# Patient Record
Sex: Female | Born: 1965 | Smoking: Never smoker
Health system: Southern US, Community
[De-identification: ages and names within clinical notes are randomized; demographics above are authoritative.]

## PROBLEM LIST (undated history)

## (undated) DIAGNOSIS — K859 Acute pancreatitis without necrosis or infection, unspecified: Secondary | ICD-10-CM

## (undated) HISTORY — PX: CHOLECYSTECTOMY: SHX55

---

## 2008-01-18 ENCOUNTER — Other Ambulatory Visit: Payer: Self-pay

## 2008-01-18 ENCOUNTER — Inpatient Hospital Stay: Payer: Self-pay | Admitting: Internal Medicine

## 2008-02-02 ENCOUNTER — Ambulatory Visit: Payer: Self-pay | Admitting: Surgery

## 2013-07-11 ENCOUNTER — Inpatient Hospital Stay: Payer: Self-pay | Admitting: Internal Medicine

## 2013-07-11 LAB — CBC
HCT: 39.6 % (ref 35.0–47.0)
HGB: 12.4 g/dL (ref 12.0–16.0)
MCH: 25.8 pg — ABNORMAL LOW (ref 26.0–34.0)
MCHC: 31.4 g/dL — ABNORMAL LOW (ref 32.0–36.0)
MCV: 82 fL (ref 80–100)
Platelet: 242 10*3/uL (ref 150–440)
RBC: 4.82 10*6/uL (ref 3.80–5.20)
RDW: 13.8 % (ref 11.5–14.5)
WBC: 16.5 10*3/uL — AB (ref 3.6–11.0)

## 2013-07-11 LAB — COMPREHENSIVE METABOLIC PANEL
ALBUMIN: 3.3 g/dL — AB (ref 3.4–5.0)
ALT: 22 U/L (ref 12–78)
Alkaline Phosphatase: 82 U/L
Anion Gap: 8 (ref 7–16)
BUN: 6 mg/dL — AB (ref 7–18)
Bilirubin,Total: 0.2 mg/dL (ref 0.2–1.0)
CHLORIDE: 106 mmol/L (ref 98–107)
Calcium, Total: 8.9 mg/dL (ref 8.5–10.1)
Co2: 23 mmol/L (ref 21–32)
Creatinine: 0.85 mg/dL (ref 0.60–1.30)
EGFR (African American): >60
EGFR (Non-African Amer.): >60
GLUCOSE: 114 mg/dL — AB (ref 65–99)
Osmolality: 272 (ref 275–301)
POTASSIUM: 3.4 mmol/L — AB (ref 3.5–5.1)
SGOT(AST): 24 U/L (ref 15–37)
Sodium: 137 mmol/L (ref 136–145)
Total Protein: 7.5 g/dL (ref 6.4–8.2)

## 2013-07-11 LAB — TROPONIN I
Troponin-I: <0.02 ng/mL
Troponin-I: <0.02 ng/mL
Troponin-I: <0.02 ng/mL

## 2013-07-11 LAB — TSH: THYROID STIMULATING HORM: 1.02 u[IU]/mL

## 2013-07-12 DIAGNOSIS — I059 Rheumatic mitral valve disease, unspecified: Secondary | ICD-10-CM

## 2013-07-12 LAB — BASIC METABOLIC PANEL
Anion Gap: 4 — ABNORMAL LOW (ref 7–16)
BUN: 10 mg/dL (ref 7–18)
CHLORIDE: 108 mmol/L — AB (ref 98–107)
Calcium, Total: 8.5 mg/dL (ref 8.5–10.1)
Co2: 26 mmol/L (ref 21–32)
Creatinine: 0.83 mg/dL (ref 0.60–1.30)
EGFR (African American): >60
Glucose: 106 mg/dL — ABNORMAL HIGH (ref 65–99)
Osmolality: 275 (ref 275–301)
Potassium: 3.6 mmol/L (ref 3.5–5.1)
SODIUM: 138 mmol/L (ref 136–145)

## 2013-07-12 LAB — CBC WITH DIFFERENTIAL/PLATELET
BASOS PCT: 0.2 %
Basophil #: 0 10*3/uL (ref 0.0–0.1)
EOS PCT: 0 %
Eosinophil #: 0 10*3/uL (ref 0.0–0.7)
HCT: 35.1 % (ref 35.0–47.0)
HGB: 11.3 g/dL — AB (ref 12.0–16.0)
Lymphocyte #: 1.8 10*3/uL (ref 1.0–3.6)
Lymphocyte %: 8.9 %
MCH: 26.6 pg (ref 26.0–34.0)
MCHC: 32 g/dL (ref 32.0–36.0)
MCV: 83 fL (ref 80–100)
Monocyte #: 1.4 x10 3/mm — ABNORMAL HIGH (ref 0.2–0.9)
Monocyte %: 6.9 %
NEUTROS PCT: 84 %
Neutrophil #: 16.9 10*3/uL — ABNORMAL HIGH (ref 1.4–6.5)
Platelet: 234 10*3/uL (ref 150–440)
RBC: 4.23 10*6/uL (ref 3.80–5.20)
RDW: 14 % (ref 11.5–14.5)
WBC: 20.2 10*3/uL — ABNORMAL HIGH (ref 3.6–11.0)

## 2013-07-13 LAB — WBC: WBC: 15.7 10*3/uL — ABNORMAL HIGH (ref 3.6–11.0)

## 2013-07-16 LAB — CULTURE, BLOOD (SINGLE)

## 2014-09-11 NOTE — Discharge Summary (Signed)
PATIENT NAME:  Brandi Randolph, Tomara MR#:  [id number] DATE OF BIRTH:  1965-11-08  DATE OF ADMISSION:  07/11/2013 DATE OF DISCHARGE:  07/13/2013  PRESENTING COMPLAINT:  Cough and shortness of breath.   DISCHARGE DIAGNOSES:  1. Right lower lobe pneumonia.  2. Ongoing tobacco abuse.  3. Sinus congestion.  4. Saturation 91% to 95% on room air.   MEDICATIONS: 1. Acetaminophen 325 two tablets every 4 hours as needed.  2. Budesonide/formoterol 160/4.5 inhalation 1 puff b.i.d.  3. Guaifenesin 1200 mg p.o. b.i.d.  4. Tussionex 5 mL b.i.d. p.r.n.  5. Levaquin 750 mg p.o. daily.  6. Combivent Respimat 1 puff 4 times a day as needed.   FOLLOWUP: Establish primary care or go to urgent care as needed. The patient advised to get repeat CT chest in 3 months.   LABORATORY DATA AT DISCHARGE: White count is 15.7. Echo is EF of 60% to 65%. Normal left ventricular systolic function. Mild dilated left atrium. Mild mitral valve regurgitation. Mild tricuspid regurgitation. H and H is 11.3 and 35.1, platelet count is 234. Glucose is 106, BUN is 10, creatinine is 0.83, sodium is 138, potassium is 3.6, chloride is 108. CT chest showed no evidence of PE. Prominent mediastinal hilar lymph nodes of uncertain clinical significance. Right posterior chest wall sebaceous cyst. Followup imaging in 3 months. Blood cultures negative in 48 hours.   BRIEF SUMMARY OF HOSPITAL COURSE: Brandi FlashSusan Hovanec is a 49 year old Caucasian female with a history of ongoing tobacco abuse, who comes into the emergency room with:   1. Systemic inflammatory response syndrome secondary to pneumonia. She was started on Rocephin and Zithromax, which was changed to p.o. Levaquin. Blood cultures remained negative. Mucinex and Tussionex were given with p.r.n. Cepacol lozenges. Her white count came down to 15,000.  2. The patient with ongoing tobacco abuse, likely has underlying chronic obstructive pulmonary disease. She is a long-term smoker. She has no  official diagnosis of chronic obstructive pulmonary disease. She has no wheezing. Steroids were not given. The patient was advised on smoking cessation, and p.r.n. inhalers were prescribed.  3. Obesity.  4. Sinus congestion. The p.r.n. Tylenol, Mucinex and Tussionex were given.  5. Hospital stay otherwise remained stable.   CODE STATUS: The patient remained a full code.   TIME SPENT: 40 minutes.   ____________________________ Wylie HailSona A. Allena KatzPatel, MD sap:lb D: 07/15/2013 06:57:03 ET T: 07/15/2013 08:18:06 ET JOB#: [id number]  cc: Koya Hunger A. Allena KatzPatel, MD, <Dictator> Willow OraSONA A Yoland Scherr MD ELECTRONICALLY SIGNED 07/19/2013 13:40

## 2014-09-11 NOTE — H&P (Signed)
PATIENT NAME:  Brandi Randolph, Brandi Randolph DATE OF BIRTH:  Sep 24, 1965  DATE OF ADMISSION:  07/11/2013  REFERRING PHYSICIAN: Charlestine NightPhillip A. Scotty CourtStafford, MD  FAMILY PHYSICIAN: None.   REASON FOR ADMISSION: Pneumonia with systemic inflammatory response syndrome.   HISTORY OF PRESENT ILLNESS: The patient is a 49 year old female who presents to the Emergency Room with a 4- to 5-day history of congestion, shortness of breath, cough and pleuritic chest pain. Some fever. In the Emergency Room, the patient was noted to be febrile and tachycardic. She was given IV fluids in the Emergency Room, with no improvement of her tachycardia. She remained febrile. Chest x-ray revealed pneumonia. Her white count was also elevated. She is now admitted for further evaluation.   PAST MEDICAL HISTORY:  1. Tobacco abuse.  2. Obesity.  3. Status post tubal ligation.   MEDICATIONS: None.   ALLERGIES: No known drug allergies.   SOCIAL HISTORY: The patient smokes less than 1/2-pack per day. Denies alcohol abuse.   FAMILY HISTORY: Positive for stroke and hypertension, but otherwise unremarkable.   REVIEW OF SYSTEMS:  CONSTITUTIONAL: The patient has had fever, but no change in weight.  EYES: No blurred or double vision. No glaucoma.  ENT: No tinnitus or hearing loss. No nasal discharge or bleeding. She has had some difficulty swallowing.  RESPIRATORY: The patient has had cough and wheezing. Denies hemoptysis.  CARDIOVASCULAR: No orthopnea. Some palpitations. No syncope.  GASTROINTESTINAL: The patient has had a small amount of nausea, vomiting and diarrhea. No abdominal pain.  GENITOURINARY: No dysuria or hematuria. No incontinence.  ENDOCRINE: No polyuria or polydipsia. No heat or cold intolerance.  HEMATOLOGIC: The patient denies anemia, easy bruising or bleeding.  LYMPHATIC: No swollen glands.  MUSCULOSKELETAL: The patient denies pain in her neck, back, shoulders, knees or hips. No gout.  NEUROLOGICAL: No  numbness or migraines. Denies stroke or seizures.  PSYCHOLOGICAL: The patient denies anxiety, insomnia or depression.   PHYSICAL EXAMINATION:  GENERAL: The patient is moderately ill-appearing, in moderate respiratory distress.  VITAL SIGNS: Currently, remarkable for a blood pressure of 147/63 with a heart rate of 120, respiratory rate of 26 and a temperature of 101.6. Saturations are 94% on room air.  HEENT: Normocephalic, atraumatic. Pupils equally round and reactive to light and accommodation. Extraocular movements are intact. Sclerae are nonicteric. Conjunctivae are clear. Oropharynx is erythematous, dry, without exudate.  NECK: Supple without JVD or bruits. No adenopathy or thyromegaly is noted.  LUNGS: Reveal basilar rhonchi, right greater than left. No wheezes or rales. Minimal dullness on the right. Respiratory effort is increased.  CARDIAC: Rapid rate with a regular rhythm. Normal S1 and S2. No significant rubs, murmurs or gallops. PMI is nondisplaced. Chest wall is nontender.  ABDOMEN: Soft, nontender, with normoactive bowel sounds. No organomegaly or masses were appreciated. No hernias or bruits were noted.  EXTREMITIES: Without clubbing, cyanosis or edema. Pulses were 2+ bilaterally.  SKIN: Warm and dry, without rash or lesions.  NEUROLOGIC: Revealed cranial nerves II through XII grossly intact. Deep tendon reflexes were symmetric. Motor and sensory examination is nonfocal.  PSYCHIATRIC: Revealed a patient who is alert and oriented to person, place and time. She was cooperative and used good judgment.   LABORATORY DATA: EKG revealed sinus tachycardia with no acute ischemic changes. Chest x-ray revealed a right lower lobe infiltrate consistent with pneumonia. Her white count was 16.5 with a hemoglobin of 12.4. Glucose 114 with a BUN of 6, creatinine of 0.85 with a sodium of 137, a  potassium of 3.4 and a GFR of greater than 60. ABG on room air revealed a pO2 of 76 with a pH of 7.47.    ASSESSMENT:  1. Pneumonia.  2. Systemic inflammatory response syndrome.  3. Tachycardia.  4. Hypokalemia.  5. Obesity.  6. History of tobacco abuse.  7. Pleuritic chest pain.   PLAN: The patient will be admitted to off-unit telemetry and maintained on IV fluids with potassium supplementation as well as IV antibiotics. Will give a 1-time dose of IV Solu-Medrol. Will begin Symbicort and DuoNeb SVNs. Will supplement oxygen and wean as tolerated. Will follow serial cardiac enzymes because of her chest pain while on telemetry. Will obtain an echocardiogram because of her shortness of breath. Will also obtain a CT of the chest to rule out pulmonary embolism because of her pleuritic-type chest pain. Follow up routine labs in the morning. Regular diet for now. Further treatment and evaluation will depend upon the patient's progress.   TOTAL TIME SPENT ON THIS PATIENT: 50 minutes.   ____________________________ Duane Lope Judithann Sheen, MD jds:lb D: 07/11/2013 10:55:18 ET T: 07/11/2013 11:28:22 ET JOB#: [id number]  cc: Duane Lope. Judithann Sheen, MD, <Dictator> Tajia Szeliga Rodena Medin MD ELECTRONICALLY SIGNED 07/11/2013 14:56

## 2015-10-29 IMAGING — CT CT ANGIO CHEST
2 of 6 series · 18 of 36 positions shown · IV contrast (APPLIED)
Comparison: None.

CLINICAL DATA: Chest congestion and pain.

EXAM:
CT ANGIOGRAPHY CHEST WITH CONTRAST
TECHNIQUE: Multidetector CT imaging of the chest was performed using the
standard protocol during bolus administration of intravenous
contrast. Multiplanar CT image reconstructions and MIPs were
obtained to evaluate the vascular anatomy.
CONTRAST:  100 cc of Isovue 370

[Series 5: pe 1.0 thins · axial · 0.68mm/px · z∈[-646,-379]mm · 17 of 301 slices shown]
[im 17/301  lung]
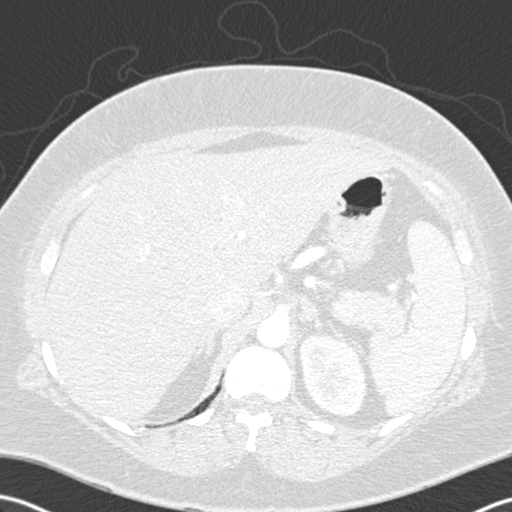
[im 34/301  mediastinal]
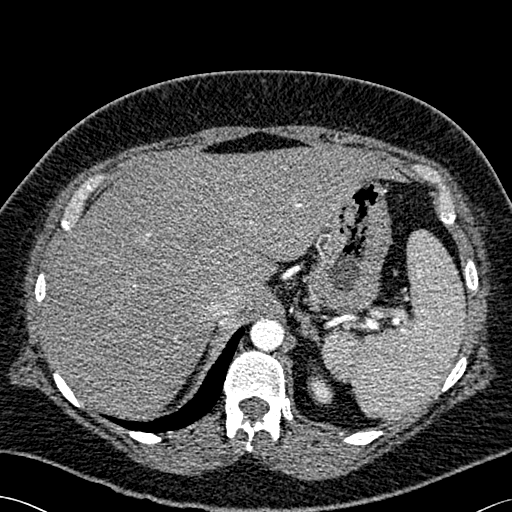
[im 51/301  lung]
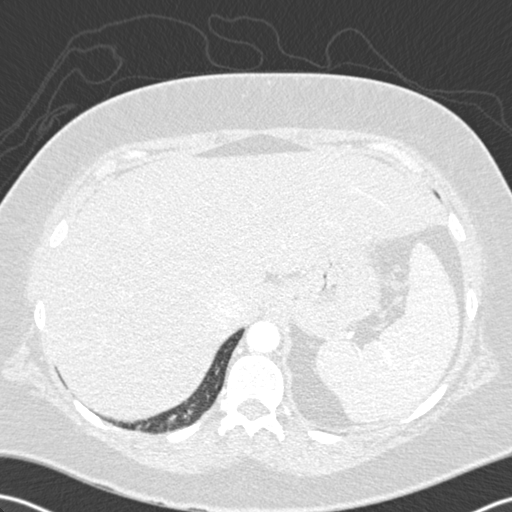
[im 67/301  mediastinal]
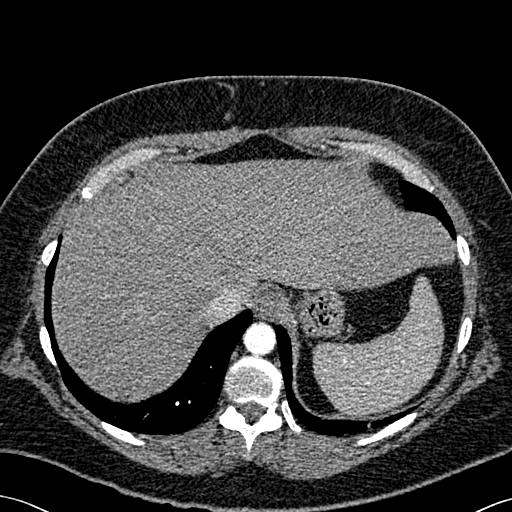
[im 84/301  lung]
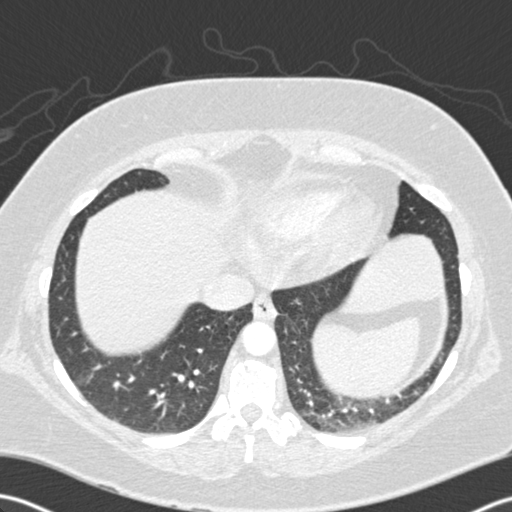
[im 101/301  mediastinal]
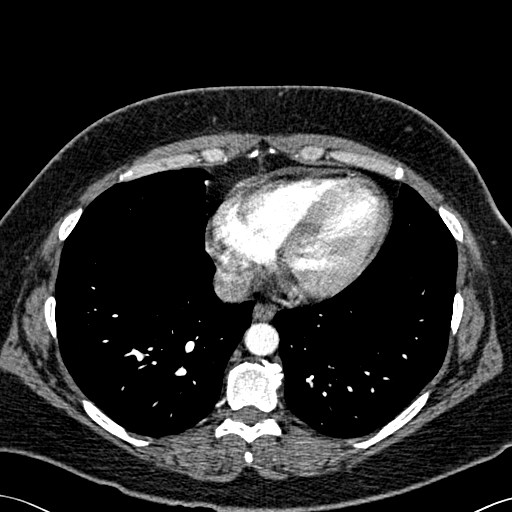
[im 117/301  lung]
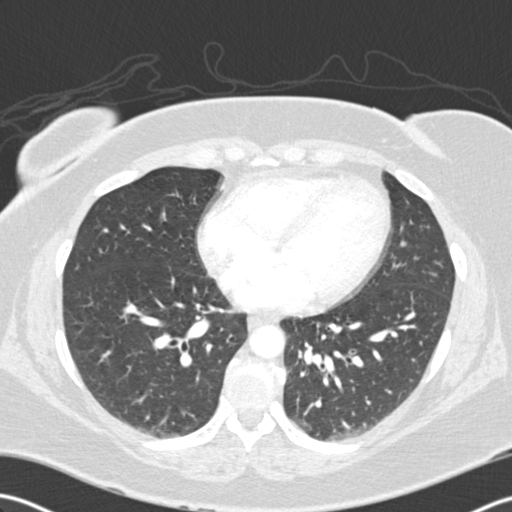
[im 134/301  mediastinal]
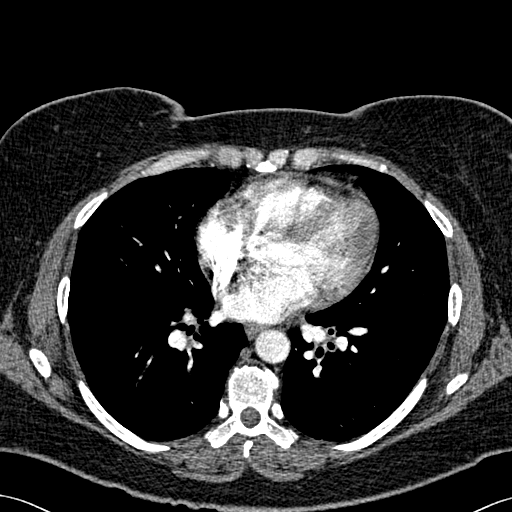
[im 151/301  lung]
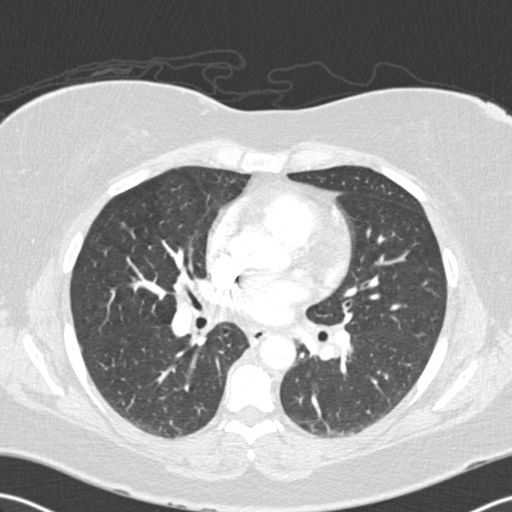
[im 167/301  mediastinal]
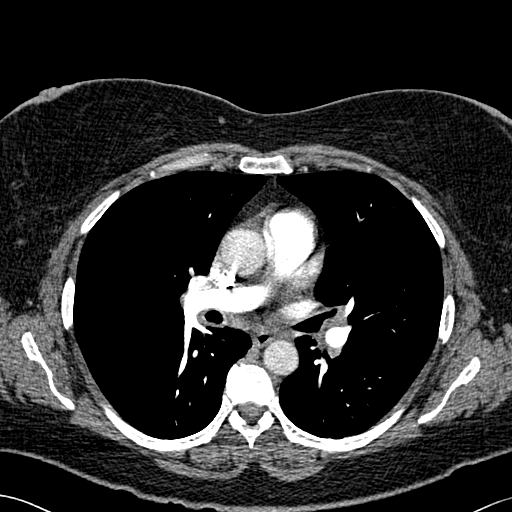
[im 184/301  lung]
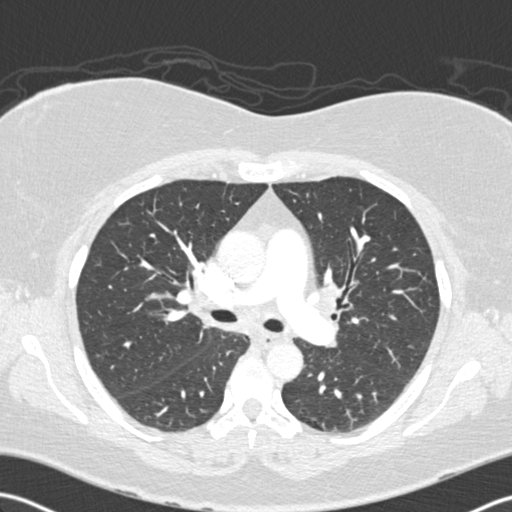
[im 201/301  mediastinal]
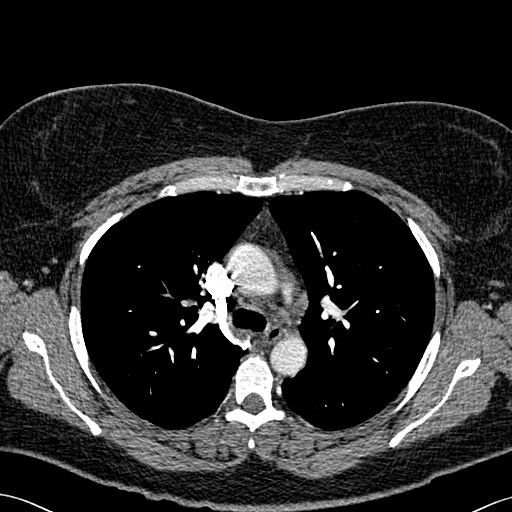
[im 217/301  lung]
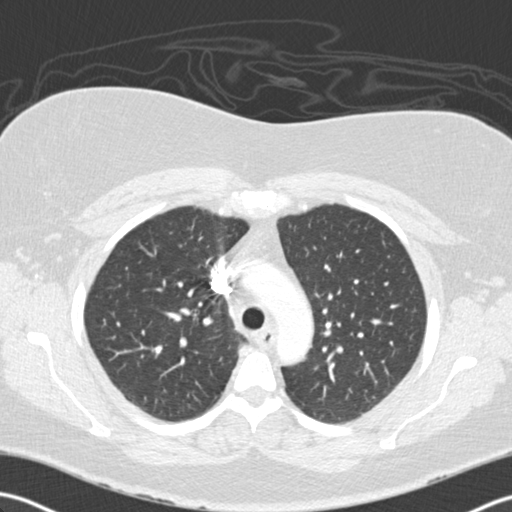
[im 234/301  mediastinal]
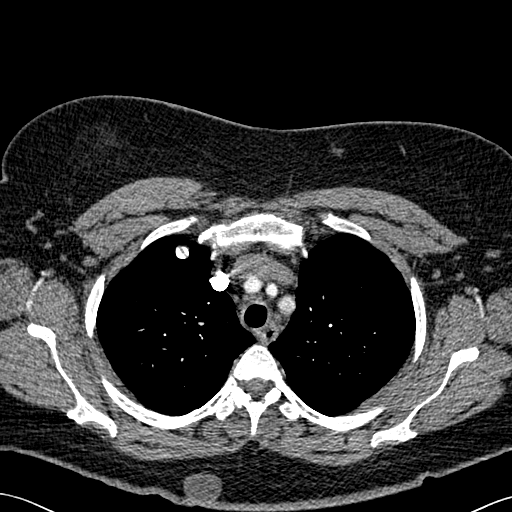
[im 251/301  lung]
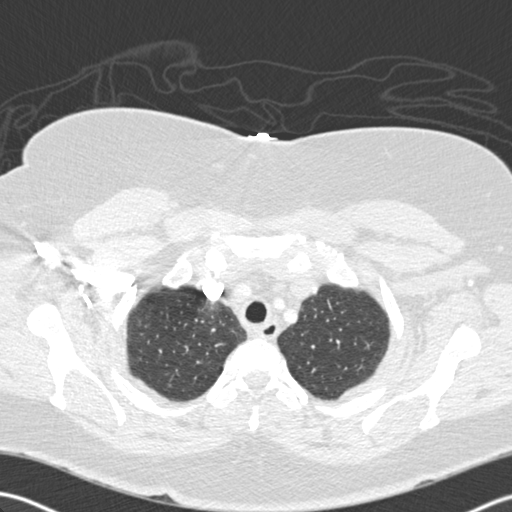
[im 267/301  mediastinal]
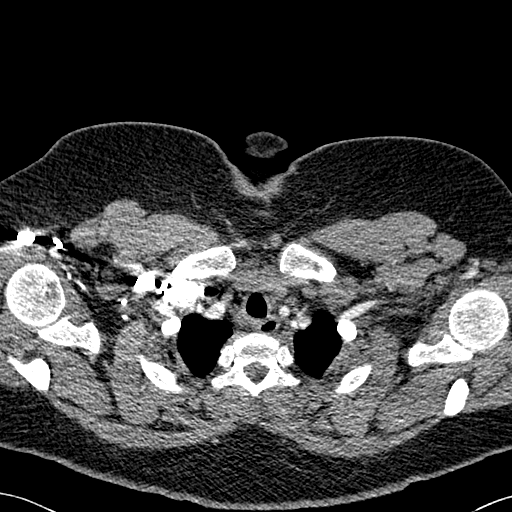
[im 284/301  lung]
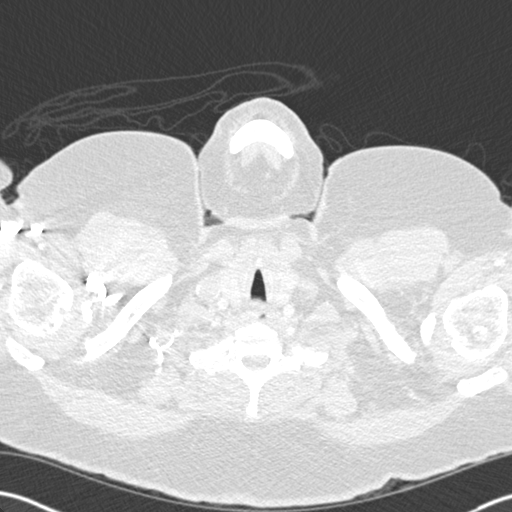

[Series 7: cor pe 2.0 mpr · coronal · 0.60mm/px · 1 of 140 slices shown]
[im 70/140  mediastinal]
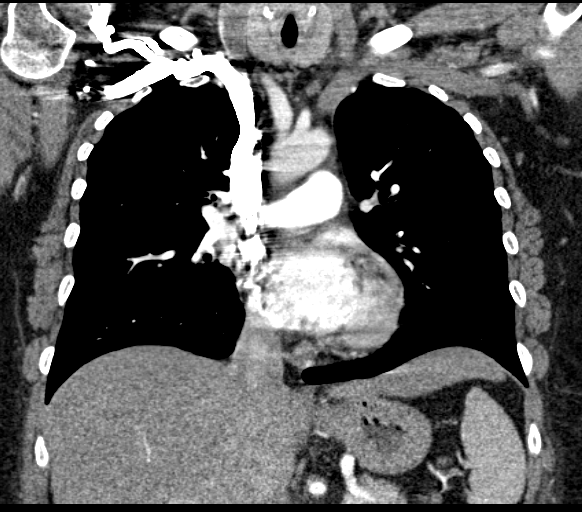

[18 of 36 positions shown; findings below may reference images not displayed]

FINDINGS: No pleural effusion identified. There is mild dependent changes
identified within the posterior lung bases. No suspicious pulmonary
nodule or mass identified. The heart size appears normal. There is
no pericardial effusion identified. Left hilar lymph node is
prominent measuring 1 cm, image 118/series 5. The right hilar lymph
node measures 1.2 cm, 123/series 5. 1.5 cm sub- carinal lymph node
is identified. The main pulmonary artery is patent. There is no
segmental or lobar pulmonary artery filling defects to suggest a
clinically significant acute pulmonary embolus.

No axillary or supraclavicular adenopathy identified. Within the
right posterior chest wall there is a 2.9 cm subcutaneous,
well-circumscribed structure, image 33/series 4. This is favored to
represent a sebaceous cyst.

Incidental imaging through the upper abdomen demonstrates no acute
findings.

Review of the visualized osseous structures is significant for mild
spondylosis. There are no aggressive lytic or sclerotic bone lesions
identified.

Review of the MIP images confirms the above findings.
IMPRESSION: 1. No evidence for acute pulmonary embolus.
2. Prominent mediastinal and hilar lymph nodes are of uncertain
clinical significance. Differential considerations include reactive
adenopathy, metastatic adenopathy, lymphoproliferative disorder, or
granulomatous inflammation or infection. Followup imaging in 3
months is suggested.
3. Right posterior chest wall sebaceous cyst.

## 2023-12-29 ENCOUNTER — Inpatient Hospital Stay
Admission: EM | Admit: 2023-12-29 | Discharge: 2024-01-02 | DRG: 872 | Disposition: A | Discharge status: Home / Self Care | Payer: Self-pay | Attending: Hospitalist | Admitting: Hospitalist

## 2023-12-29 ENCOUNTER — Other Ambulatory Visit: Payer: Self-pay

## 2023-12-29 ENCOUNTER — Emergency Department: Payer: Self-pay

## 2023-12-29 DIAGNOSIS — Z8249 Family history of ischemic heart disease and other diseases of the circulatory system: Secondary | ICD-10-CM

## 2023-12-29 DIAGNOSIS — G4730 Sleep apnea, unspecified: Secondary | ICD-10-CM | POA: Diagnosis present

## 2023-12-29 DIAGNOSIS — L299 Pruritus, unspecified: Secondary | ICD-10-CM | POA: Diagnosis present

## 2023-12-29 DIAGNOSIS — A419 Sepsis, unspecified organism: Secondary | ICD-10-CM | POA: Diagnosis present

## 2023-12-29 DIAGNOSIS — E66813 Obesity, class 3: Secondary | ICD-10-CM | POA: Diagnosis present

## 2023-12-29 DIAGNOSIS — K5900 Constipation, unspecified: Secondary | ICD-10-CM | POA: Diagnosis present

## 2023-12-29 DIAGNOSIS — R1032 Left lower quadrant pain: Secondary | ICD-10-CM | POA: Diagnosis not present

## 2023-12-29 DIAGNOSIS — Z9049 Acquired absence of other specified parts of digestive tract: Secondary | ICD-10-CM

## 2023-12-29 DIAGNOSIS — K5792 Diverticulitis of intestine, part unspecified, without perforation or abscess without bleeding: Secondary | ICD-10-CM | POA: Diagnosis present

## 2023-12-29 DIAGNOSIS — Z8719 Personal history of other diseases of the digestive system: Secondary | ICD-10-CM

## 2023-12-29 DIAGNOSIS — Z6841 Body Mass Index (BMI) 40.0 and over, adult: Secondary | ICD-10-CM

## 2023-12-29 DIAGNOSIS — R000 Tachycardia, unspecified: Secondary | ICD-10-CM | POA: Insufficient documentation

## 2023-12-29 DIAGNOSIS — E872 Acidosis, unspecified: Secondary | ICD-10-CM | POA: Diagnosis present

## 2023-12-29 DIAGNOSIS — R109 Unspecified abdominal pain: Secondary | ICD-10-CM | POA: Insufficient documentation

## 2023-12-29 DIAGNOSIS — F17290 Nicotine dependence, other tobacco product, uncomplicated: Secondary | ICD-10-CM | POA: Diagnosis present

## 2023-12-29 DIAGNOSIS — Z79899 Other long term (current) drug therapy: Secondary | ICD-10-CM | POA: Diagnosis not present

## 2023-12-29 DIAGNOSIS — R6520 Severe sepsis without septic shock: Secondary | ICD-10-CM | POA: Diagnosis present

## 2023-12-29 DIAGNOSIS — K5720 Diverticulitis of large intestine with perforation and abscess without bleeding: Secondary | ICD-10-CM | POA: Diagnosis present

## 2023-12-29 HISTORY — DX: Acute pancreatitis without necrosis or infection, unspecified: K85.90

## 2023-12-29 HISTORY — DX: Morbid (severe) obesity due to excess calories: E66.01

## 2023-12-29 LAB — URINALYSIS, ROUTINE W REFLEX MICROSCOPIC
Bilirubin Urine: NEGATIVE
Glucose, UA: NEGATIVE mg/dL
Hgb urine dipstick: NEGATIVE
Ketones, ur: NEGATIVE mg/dL
Leukocytes,Ua: NEGATIVE
Nitrite: NEGATIVE
Protein, ur: NEGATIVE mg/dL
Specific Gravity, Urine: 1.018 (ref 1.005–1.030)
pH: 6 (ref 5.0–8.0)

## 2023-12-29 LAB — COMPREHENSIVE METABOLIC PANEL WITH GFR
ALT: 20 U/L (ref 0–44)
AST: 26 U/L (ref 15–41)
Albumin: 4.2 g/dL (ref 3.5–5.0)
Alkaline Phosphatase: 77 U/L (ref 38–126)
Anion gap: 13 (ref 5–15)
BUN: 16 mg/dL (ref 6–20)
CO2: 21 mmol/L — ABNORMAL LOW (ref 22–32)
Calcium: 9.9 mg/dL (ref 8.9–10.3)
Chloride: 105 mmol/L (ref 98–111)
Creatinine, Ser: 0.77 mg/dL (ref 0.44–1.00)
GFR, Estimated: >60 mL/min (ref >60)
Glucose, Bld: 134 mg/dL — ABNORMAL HIGH (ref 70–99)
Potassium: 3.8 mmol/L (ref 3.5–5.1)
Sodium: 139 mmol/L (ref 135–145)
Total Bilirubin: 0.6 mg/dL (ref 0.0–1.2)
Total Protein: 8 g/dL (ref 6.5–8.1)

## 2023-12-29 LAB — CBC WITH DIFFERENTIAL/PLATELET
Abs Immature Granulocytes: 0.12 K/uL — ABNORMAL HIGH (ref 0.00–0.07)
Basophils Absolute: 0.1 K/uL (ref 0.0–0.1)
Basophils Relative: 1 %
Eosinophils Absolute: 0.2 K/uL (ref 0.0–0.5)
Eosinophils Relative: 1 %
HCT: 45 % (ref 36.0–46.0)
Hemoglobin: 14.6 g/dL (ref 12.0–15.0)
Immature Granulocytes: 1 %
Lymphocytes Relative: 9 %
Lymphs Abs: 1.9 K/uL (ref 0.7–4.0)
MCH: 26.1 pg (ref 26.0–34.0)
MCHC: 32.4 g/dL (ref 30.0–36.0)
MCV: 80.5 fL (ref 80.0–100.0)
Monocytes Absolute: 0.8 K/uL (ref 0.1–1.0)
Monocytes Relative: 4 %
Neutro Abs: 17.9 K/uL — ABNORMAL HIGH (ref 1.7–7.7)
Neutrophils Relative %: 84 %
Platelets: 354 K/uL (ref 150–400)
RBC: 5.59 MIL/uL — ABNORMAL HIGH (ref 3.87–5.11)
RDW: 13.7 % (ref 11.5–15.5)
WBC: 21 K/uL — ABNORMAL HIGH (ref 4.0–10.5)
nRBC: 0 % (ref 0.0–0.2)

## 2023-12-29 LAB — LACTIC ACID, PLASMA
Lactic Acid, Venous: 2.1 mmol/L (ref 0.5–1.9)
Lactic Acid, Venous: 2.3 mmol/L (ref 0.5–1.9)
Lactic Acid, Venous: 1.6 mmol/L (ref 0.5–1.9)

## 2023-12-29 LAB — PHOSPHORUS: Phosphorus: 3.3 mg/dL (ref 2.5–4.6)

## 2023-12-29 LAB — TROPONIN I (HIGH SENSITIVITY): Troponin I (High Sensitivity): 4 ng/L (ref <18)

## 2023-12-29 LAB — LIPASE, BLOOD: Lipase: 32 U/L (ref 11–51)

## 2023-12-29 LAB — MAGNESIUM: Magnesium: 1.9 mg/dL (ref 1.7–2.4)

## 2023-12-29 MED ORDER — METRONIDAZOLE 500 MG/100ML IV SOLN: 500.0000 mg | Freq: Two times a day (BID) | INTRAVENOUS | Status: DC

## 2023-12-29 MED ORDER — SODIUM CHLORIDE 0.9 % IV SOLN
2.0000 g | INTRAVENOUS | Status: DC
  Administered 2023-12-29 – 2023-12-31 (×5): 2 g via INTRAVENOUS
  Filled 2023-12-29 (×5): qty 20

## 2023-12-29 MED ORDER — HEPARIN SODIUM (PORCINE) 5000 UNIT/ML IJ SOLN
5000.0000 [IU] | Freq: Three times a day (TID) | INTRAMUSCULAR | Status: AC
  Administered 2023-12-30 – 2023-12-31 (×6): 5000 [IU] via SUBCUTANEOUS
  Filled 2023-12-29 (×4): qty 1

## 2023-12-29 MED ORDER — METRONIDAZOLE 500 MG/100ML IV SOLN
500.0000 mg | Freq: Two times a day (BID) | INTRAVENOUS | Status: DC
  Administered 2023-12-30 – 2024-01-02 (×13): 500 mg via INTRAVENOUS
  Filled 2023-12-29 (×9): qty 100

## 2023-12-29 MED ORDER — IOHEXOL 300 MG/ML  SOLN
100.0000 mL | Freq: Once | INTRAMUSCULAR | Status: AC | PRN
  Administered 2023-12-29: 100 mL via INTRAVENOUS

## 2023-12-29 MED ORDER — DIPHENHYDRAMINE HCL 50 MG/ML IJ SOLN: 12.5000 mg | Freq: Four times a day (QID) | INTRAMUSCULAR | Status: AC | PRN

## 2023-12-29 MED ORDER — ONDANSETRON HCL 4 MG/2ML IJ SOLN
4.0000 mg | Freq: Once | INTRAMUSCULAR | Status: AC
  Administered 2023-12-29: 4 mg via INTRAVENOUS
  Filled 2023-12-29: qty 2

## 2023-12-29 MED ORDER — LACTATED RINGERS IV BOLUS (SEPSIS)
1000.0000 mL | Freq: Once | INTRAVENOUS | Status: AC
  Administered 2023-12-29: 1000 mL via INTRAVENOUS

## 2023-12-29 MED ORDER — VANCOMYCIN HCL IN DEXTROSE 1-5 GM/200ML-% IV SOLN
1000.0000 mg | Freq: Once | INTRAVENOUS | Status: AC
  Administered 2023-12-29: 1000 mg via INTRAVENOUS
  Filled 2023-12-29: qty 200

## 2023-12-29 MED ORDER — ACETAMINOPHEN 650 MG RE SUPP: 650.0000 mg | Freq: Four times a day (QID) | RECTAL | Status: DC | PRN

## 2023-12-29 MED ORDER — ACETAMINOPHEN 325 MG PO TABS
650.0000 mg | ORAL_TABLET | Freq: Four times a day (QID) | ORAL | Status: DC | PRN
  Administered 2023-12-31 (×2): 650 mg via ORAL
  Filled 2023-12-29: qty 2

## 2023-12-29 MED ORDER — HYDROMORPHONE HCL 1 MG/ML IJ SOLN
0.5000 mg | Freq: Once | INTRAMUSCULAR | Status: AC
  Administered 2023-12-29: 0.5 mg via INTRAVENOUS
  Filled 2023-12-29: qty 0.5

## 2023-12-29 MED ORDER — METRONIDAZOLE 500 MG/100ML IV SOLN
500.0000 mg | Freq: Once | INTRAVENOUS | Status: AC
  Administered 2023-12-29: 500 mg via INTRAVENOUS
  Filled 2023-12-29: qty 100

## 2023-12-29 MED ORDER — ONDANSETRON HCL 4 MG PO TABS
4.0000 mg | ORAL_TABLET | Freq: Four times a day (QID) | ORAL | Status: DC | PRN
Start: 2023-12-29 — End: 2024-01-02

## 2023-12-29 MED ORDER — MORPHINE SULFATE (PF) 4 MG/ML IV SOLN
4.0000 mg | INTRAVENOUS | Status: AC | PRN
  Administered 2023-12-30 (×2): 4 mg via INTRAVENOUS
  Filled 2023-12-29: qty 1

## 2023-12-29 MED ORDER — LACTATED RINGERS IV BOLUS (SEPSIS)
800.0000 mL | Freq: Once | INTRAVENOUS | Status: AC
  Administered 2023-12-29: 800 mL via INTRAVENOUS

## 2023-12-29 MED ORDER — SODIUM CHLORIDE 0.9 % IV SOLN
2.0000 g | Freq: Once | INTRAVENOUS | Status: AC
  Administered 2023-12-29: 2 g via INTRAVENOUS
  Filled 2023-12-29: qty 12.5

## 2023-12-29 MED ORDER — SODIUM CHLORIDE 0.9 % IV SOLN
12.5000 mg | Freq: Four times a day (QID) | INTRAVENOUS | Status: AC | PRN
  Filled 2023-12-29: qty 0.5

## 2023-12-29 MED ORDER — HYDROMORPHONE HCL 1 MG/ML IJ SOLN
0.5000 mg | INTRAMUSCULAR | Status: AC | PRN
  Administered 2023-12-29 – 2023-12-30 (×3): 0.5 mg via INTRAVENOUS
  Filled 2023-12-29 (×2): qty 0.5

## 2023-12-29 MED ORDER — METOPROLOL TARTRATE 5 MG/5ML IV SOLN: 5.0000 mg | INTRAVENOUS | Status: DC | PRN

## 2023-12-29 MED ORDER — ONDANSETRON HCL 4 MG/2ML IJ SOLN
4.0000 mg | Freq: Four times a day (QID) | INTRAMUSCULAR | Status: DC | PRN
  Administered 2023-12-29: 4 mg via INTRAVENOUS
  Filled 2023-12-29: qty 2

## 2023-12-29 MED ORDER — SODIUM CHLORIDE 0.9 % IV SOLN: INTRAVENOUS | Status: AC

## 2023-12-29 NOTE — Progress Notes (Signed)
 CODE SEPSIS - PHARMACY COMMUNICATION  **Broad Spectrum Antibiotics should be administered within 1 hour of Sepsis diagnosis**  Time Code Sepsis Called/Page Received: 14:15  Antibiotics Ordered: Cefepime , Metronidazole , and Vancomycin   Time of 1st antibiotic administration: Cefepime  given at 14:56  Additional action taken by pharmacy: n/a  If necessary, Name of Provider/Nurse Contacted: n/a    Brandi Randolph ,PharmD Clinical Pharmacist  12/29/2023  3:26 PM

## 2023-12-29 NOTE — Assessment & Plan Note (Signed)
 Suspect secondary to complicated diverticulitis causing pain Symptomatic support per above Metronidazole  500 mg IV every 4 hours as needed for heart rate greater than 120, 3 doses ordered

## 2023-12-29 NOTE — Assessment & Plan Note (Signed)
 -  This complicates overall care and prognosis.

## 2023-12-29 NOTE — Hospital Course (Signed)
 Ms. Brandi Randolph is a 58 year old female with history of pancreatitis status postcholecystectomy, morbid obesity, who presents emergency department for chief concerns of abdominal pain.  Vitals in the ED showed t of 97.8, respiration 18, heart rate 109, blood pressure 143/88, SpO2 of 96% on room air.  Serum sodium is 139, potassium 3.8, chloride 105, bicarb 21, BUN of 16, serum creatinine 0.77, EGFR greater than 60, nonfasting blood glucose 135, WBC 21, hemoglobin 14.6, platelet 354.  Lactic acid 2.1.  HS troponin is 4.  UA was negative for leukocytes and nitrates.  ED treatment: Ceftriaxone  2 g IV one-time dose, vancomycin  per pharmacy one-time dose, metronidazole  500 mg IV one-time dose, LR 1.8 L bolus, Dilaudid  0.5 mg x 2, ondansetron  4 mg IV one-time dose.

## 2023-12-29 NOTE — ED Triage Notes (Signed)
 Reports last night started having abd pain lower and now it is coming up higher.  Reports hx of pancreatitis.  +nausea no vomiting.  Loss of appetite.

## 2023-12-29 NOTE — Consult Note (Signed)
 [facility] SURGICAL CONSULTATION NOTE (initial) - cpt: 00746   HISTORY OF PRESENT ILLNESS (HPI):  58 y.o. female presented to [facility] ED yesterday for evaluation of lower abdominal pain. Patient reports attempting to eat more vegetables, but on 8/09 her abd pain became worse, and more localized to the left.  Bowel habits: She reports having constipation which she defines as being daily or every other day movements.  Often with some significant effort.  Following her cholecystectomy, she knows how to loosen her stool with her diet.  She had approximately 3 days where she had some left lower quadrant discomfort but it got significantly severe the last day prior to admission.  She also had bloating.  She reports she otherwise works at home and is quite sedentary aside from walking her dog 3 times daily.  She has had no prior colonoscopy and this is her first episode of diverticulitis.  Surgery is consulted by hospitalist physician Dr. Sherre in this context for evaluation and management of diverticulitis with microperforation.  PAST MEDICAL HISTORY (PMH):  Past Medical History:  Diagnosis Date   Pancreatitis      PAST SURGICAL HISTORY (PSH):  Past Surgical History:  Procedure Laterality Date   CHOLECYSTECTOMY       MEDICATIONS:  Prior to Admission medications   Not on File     ALLERGIES:  No Known Allergies   SOCIAL HISTORY:  Social History   Socioeconomic History   Marital status: Divorced    Spouse name: Not on file   Number of children: Not on file   Years of education: Not on file   Highest education level: Not on file  Occupational History   Not on file  Tobacco Use   Smoking status: Never   Smokeless tobacco: Never  Vaping Use   Vaping status: Every Day  Substance and Sexual Activity   Alcohol use: Never   Drug use: Never   Sexual activity: Not Currently  Other Topics Concern   Not on file  Social History Narrative   Not on file   Social Drivers of  Health   Financial Resource Strain: Not on file  Food Insecurity: No Food Insecurity (12/30/2023)   Hunger Vital Sign    Worried About Running Out of Food in the Last Year: Never true    Ran Out of Food in the Last Year: Never true  Transportation Needs: No Transportation Needs (12/30/2023)   PRAPARE - Administrator, Civil Service (Medical): No    Lack of Transportation (Non-Medical): No  Physical Activity: Not on file  Stress: Not on file  Social Connections: Moderately Isolated (12/30/2023)   Social Connection and Isolation Panel    Frequency of Communication with Friends and Family: More than three times a week    Frequency of Social Gatherings with Friends and Family: Once a week    Attends Religious Services: 1 to 4 times per year    Active Member of [employer] or Organizations: No    Attends Banker Meetings: Never    Marital Status: Divorced  Catering manager Violence: Not At Risk (12/30/2023)   Humiliation, Afraid, Rape, and Kick questionnaire    Fear of Current or Ex-Partner: No    Emotionally Abused: No    Physically Abused: No    Sexually Abused: No     FAMILY HISTORY:  Family History  Problem Relation Age of Onset   Hypertension Mother       REVIEW OF SYSTEMS:  Review of Systems  Constitutional:  Positive for fever. Negative for chills and weight loss.  HENT: Negative.    Eyes: Negative.   Respiratory:  Negative for cough.   Cardiovascular:  Negative for chest pain.  Gastrointestinal:  Positive for abdominal pain and constipation. Negative for diarrhea.  Genitourinary:  Negative for dysuria and urgency.  Skin: Negative.   Neurological: Negative.   Psychiatric/Behavioral: Negative.    All other systems reviewed and are negative.   VITAL SIGNS:  Temp:  [97.7 F (36.5 C)-98.4 F (36.9 C)] 98.4 F (36.9 C) (08/11 0959) Pulse Rate:  [93-135] 95 (08/11 0958) Resp:  [15-36] 17 (08/11 0958) BP: (110-152)/(65-94) 130/65 (08/11 0958) SpO2:   [86 %-100 %] 98 % (08/11 0958) Weight:  [136.1 kg] 136.1 kg (08/10 1318)     Height: 5' 4 (162.6 cm) Weight: 136.1 kg BMI (Calculated): 51.47   INTAKE/OUTPUT:  08/10 0701 - 08/11 0700 In: 88 [IV Piggyback:88] Out: -   PHYSICAL EXAM:  Physical Exam Blood pressure 130/65, pulse 95, temperature 98.4 F (36.9 C), temperature source Oral, resp. rate 17, height 5' 4 (1.626 m), weight 136.1 kg, SpO2 98%. Last Weight  Most recent update: 12/29/2023  1:18 PM    Weight  136.1 kg (300 lb)             CONSTITUTIONAL: Well developed, and nourished, appropriately responsive and aware without distress.   EYES: Sclera non-icteric.   EARS, NOSE, MOUTH AND THROAT:  The oropharynx is clear. Oral mucosa is pink and moist.    Hearing is intact to voice.  NECK: Trachea is midline, and there is no jugular venous distension.  LYMPH NODES:  Lymph nodes in the neck are not enlarged. RESPIRATORY:  Lungs are clear, and breath sounds are equal bilaterally.   Normal respiratory effort without pathologic use of accessory muscles. CARDIOVASCULAR: Heart is regular in rate and rhythm.   Well perfused.  GI: The abdomen is mildly tender in the left lower quadrant/suprapubic area to deep palpation, clearly no guarding, and certainly not peritonitic.  Essentially the remainder of the abdomen is soft, nontender, and nondistended. There were no palpable masses. I did not appreciate hepatosplenomegaly. There were normal bowel sounds. MUSCULOSKELETAL:  Symmetrical muscle tone appreciated in all four extremities. Warm without edema.  SKIN: Skin turgor is normal. No pathologic skin lesions appreciated.  NEUROLOGIC:  Motor and sensation appear grossly normal.  Cranial nerves are grossly without defect. PSYCH:  Alert and oriented to person, place and time. Affect is appropriate for situation.  Data Reviewed I have personally reviewed what is currently available of the patient's imaging, recent labs and medical records.     Labs:     Latest Ref Rng & Units 12/30/2023    5:52 AM 12/29/2023    1:21 PM 07/13/2013    4:55 AM  CBC  WBC 4.0 - 10.5 K/uL 16.0  21.0  15.7   Hemoglobin 12.0 - 15.0 g/dL 87.4  85.3    Hematocrit 36.0 - 46.0 % 40.3  45.0    Platelets 150 - 400 K/uL 312  354        Latest Ref Rng & Units 12/30/2023    4:06 AM 12/29/2023    1:21 PM 07/12/2013    3:39 AM  CMP  Glucose 70 - 99 mg/dL 891  865  893   BUN 6 - 20 mg/dL 11  16  10    Creatinine 0.44 - 1.00 mg/dL 9.28  9.22  9.16  Sodium 135 - 145 mmol/L 139  139  138   Potassium 3.5 - 5.1 mmol/L 4.1  3.8  3.6   Chloride 98 - 111 mmol/L 108  105  108   CO2 22 - 32 mmol/L 23  21  26    Calcium 8.9 - 10.3 mg/dL 8.5  9.9  8.5   Total Protein 6.5 - 8.1 g/dL  8.0    Total Bilirubin 0.0 - 1.2 mg/dL  0.6    Alkaline Phos 38 - 126 U/L  77    AST 15 - 41 U/L  26    ALT 0 - 44 U/L  20       Imaging studies:   Last 24 hrs: CT ABDOMEN PELVIS W CONTRAST Addendum Date: 12/29/2023 ADDENDUM REPORT: 12/29/2023 17:12 ADDENDUM: Critical Value/emergent results were called by telephone at the time of interpretation on 12/29/2023 at 5:11 pm to provider Dr. Waymond, who verbally acknowledged these results. Electronically Signed   By: Leita Birmingham M.D.   On: 12/29/2023 17:12   Result Date: 12/29/2023 CLINICAL DATA:  Abdominal pain, acute, nonlocalized. EXAM: CT ABDOMEN AND PELVIS WITH CONTRAST TECHNIQUE: Multidetector CT imaging of the abdomen and pelvis was performed using the standard protocol following bolus administration of intravenous contrast. RADIATION DOSE REDUCTION: This exam was performed according to the departmental dose-optimization program which includes automated exposure control, adjustment of the mA and/or kV according to patient size and/or use of iterative reconstruction technique. CONTRAST:  OMNIPAQUE  IOHEXOL  300 MG/ML  SOLN COMPARISON:  01/18/2008. FINDINGS: Lower chest: The heart is normal in size and there is a trace pericardial  effusion. Minimal atelectasis is noted at the lung bases. Hepatobiliary: No focal abnormality in the liver. Fatty infiltration of the liver is noted. The gallbladder is surgically absent. No biliary ductal dilatation. Pancreas: Unremarkable. No pancreatic ductal dilatation or surrounding inflammatory changes. Spleen: Normal in size without focal abnormality. Adrenals/Urinary Tract: The adrenal glands are within normal limits. The kidneys enhance normally. No renal calculus or hydronephrosis bilaterally. The bladder is unremarkable. Stomach/Bowel: The stomach is within normal limits. There is colonic diverticulosis with pericolonic fat stranding about the sigmoid colon. Suspected tiny foci of air are identified along the sigmoid colon, best seen on axial image 66 suggesting micro perforation. No abscess is seen. Appendix appears normal. No obstruction or pneumatosis. Vascular/Lymphatic: Aortic atherosclerosis. No abdominal or pelvic lymphadenopathy by size criteria. Reproductive: Uterus and bilateral adnexa are unremarkable. Other: There is a trace amount of free fluid adjacent to the sigmoid colon on the left. Musculoskeletal: Degenerative changes are present in the thoracolumbar spine. No acute osseous abnormality. IMPRESSION: 1. Acute sigmoid diverticulitis with a trace amount of free air suggesting micro perforation. No abscess is seen. 2. Hepatic steatosis. 3. Aortic atherosclerosis. Electronically Signed: By: Leita Birmingham M.D. On: 12/29/2023 17:06     Assessment/Plan:  58 y.o. female with sigmoid diverticulitis with microperforation, complicated by pertinent comorbidities including:  Patient Active Problem List   Diagnosis Date Noted   Diverticulitis 12/29/2023   Morbid obesity with BMI of 50.0-59.9, adult (HCC) 12/29/2023   Abdominal pain 12/29/2023   History of pancreatitis 12/29/2023   Sinus tachycardia 12/29/2023   Sepsis (HCC) 12/29/2023   Pruritus 12/29/2023    - Plan for continued bowel  rest, may consider clear liquid diet tomorrow if white blood cell count and pain continue to improve.  - Continue IV antibiotics for now.  Serial CBCs, serial abdominal exams, IV fluid support.  - PPI prophylaxis  -  DVT prophylaxis  - We will continue to follow along with you, my next opportunity personally will be Wednesday, please call in my associates if any change in her progress tomorrow raises concern.  All of the above findings and recommendations were discussed with the patient, and all of patient's questions were answered to their expressed satisfaction.  I personally spent a total of 60 minutes in the care of the patient today including preparing to see the patient, getting/reviewing separately obtained history, performing a medically appropriate exam/evaluation, counseling and educating, documenting clinical information in the EHR, and independently interpreting results.    Thank you for the opportunity to participate in this patient's care.   -- Honor Leghorn, M.D., FACS 12/30/2023, 12:23 PM

## 2023-12-29 NOTE — Assessment & Plan Note (Addendum)
 Patient meets sepsis criteria with severe sepsis WBC 21, increased respiration rate, heart rate, and lactic acid uptrending to 2.3 We will recheck a third lactic acid to ensure downtrending Continue with IV antibiotic per above: Metronidazole , ceftriaxone  2 g IV daily Status post aggressive fluid hydration to complete sepsis bolus based on ideal body weight Continue with sodium chloride  infusion at 125 mL/h Maintain MAP > 65

## 2023-12-29 NOTE — Progress Notes (Signed)
 Elink following for sepsis protocol.

## 2023-12-29 NOTE — Assessment & Plan Note (Signed)
Status post cholecystectomy

## 2023-12-29 NOTE — ED Provider Notes (Signed)
.-----------------------------------------   3:06 PM on 12/29/2023 -----------------------------------------  Blood pressure (!) 143/88, pulse (!) 109, temperature 97.8 F (36.6 C), temperature source Oral, resp. rate 18, height 5' 4 (1.626 m), weight 136.1 kg, SpO2 96%.  Assuming care from Dr. Ernest.  In short, Brandi Randolph is a 58 y.o. female with a chief complaint of Abdominal Pain .  Refer to the original H&P for additional details.  The current plan of care is to follow-up CT, needs admission for sepsis.  Independent review of labs imaging are below.  Given her complicated diverticulitis, she will need to be admitted for further management.  Already received IV cefepime  and Flagyl .  Consult hospitalist is agreeable with plan for admission and will evaluate the patient.  She is admitted.  Did discuss with patient about imaging lab results, she is agreeable to stay.  Would like some additional pain medications, will give her another dose of Dilaudid  here.  Clinical Course as of 12/29/23 1737  Sun Dec 29, 2023  1556 Independent review of labs, leukocytosis noted, UA is not consistent with UTI, electrolytes not severely deranged, lipase and troponin are not elevated, lactic acid is mildly elevated. [TT]  1711 CT ABDOMEN PELVIS W CONTRAST IMPRESSION: 1. Acute sigmoid diverticulitis with a trace amount of free air suggesting micro perforation. No abscess is seen. 2. Hepatic steatosis. 3. Aortic atherosclerosis.   [TT]    Clinical Course User Index [TT] Brandi Lorelle Cummins, MD      Brandi Lorelle Cummins, MD 12/29/23 [phone]

## 2023-12-29 NOTE — Assessment & Plan Note (Signed)
 Secondary to complex diverticulitis Symptomatic support: Morphine  4 mg IV every 4 hours as needed for moderate pain, for 1 day ordered; Dilaudid  0.5 mg IV every 4 hours as needed for severe pain, 1 day ordered

## 2023-12-29 NOTE — H&P (Signed)
 History and Physical   Brandi Randolph FMW:[id number] DOB: [birth year] DOA: 12/29/2023  PCP: Pcp, No  Patient coming from: home  I have personally briefly reviewed patient's old medical records in [facility] EMR.  Chief Concern: Abdominal pain, started last night  HPI: Ms. Brandi Randolph is a 58 year old female with history of pancreatitis status postcholecystectomy, morbid obesity, who presents emergency department for chief concerns of abdominal pain.  Vitals in the ED showed t of 97.8, respiration 18, heart rate 109, blood pressure 143/88, SpO2 of 96% on room air.  Serum sodium is 139, potassium 3.8, chloride 105, bicarb 21, BUN of 16, serum creatinine 0.77, EGFR greater than 60, nonfasting blood glucose 135, WBC 21, hemoglobin 14.6, platelet 354.  Lactic acid 2.1.  HS troponin is 4.  UA was negative for leukocytes and nitrates.  ED treatment: Ceftriaxone  2 g IV one-time dose, vancomycin  per pharmacy one-time dose, metronidazole  500 mg IV one-time dose, LR 1.8 L bolus, Dilaudid  0.5 mg x 2, ondansetron  4 mg IV one-time dose. ------------------------------------ At bedside, patient able to tell me her first and last name, age, location, current calendar year.  She denies trauma to her person.  She reports she started having pain that started yesterday evening.  She reports the pain is on her right lower quadrant.  She denies burning or pain when she urinates and she denies hematuria.  She denies chest pain, shortness of breath, fever, follow extremity, syncope, loss.  She endorses chills.  Social history: She lives at home with her son.  She vapes.  She denies frequent EtOH and occasional drug use.  Does not remember the last time she had alcohol.  ROS: Constitutional: no weight change, no fever ENT/Mouth: no sore throat, no rhinorrhea Eyes: no eye pain, no vision changes Cardiovascular: no chest pain, no dyspnea,  no edema, no palpitations Respiratory: no cough, no sputum, no  wheezing Gastrointestinal: + nausea, no vomiting, no diarrhea, no constipation, + abdominal pain Genitourinary: no urinary incontinence, no dysuria, no hematuria Musculoskeletal: no arthralgias, no myalgias Skin: no skin lesions, no pruritus, Neuro: + weakness, no loss of consciousness, no syncope Psych: no anxiety, no depression, + decrease appetite Heme/Lymph: no bruising, no bleeding  ED Course: Discussed with EDP, patient requiring hospitalization for chief concern of complicated diverticulitis.   Assessment/Plan  Principal Problem:   Diverticulitis Active Problems:   Morbid obesity with BMI of 50.0-59.9, adult (HCC)   Abdominal pain   History of pancreatitis   Sinus tachycardia   Sepsis (HCC)   Pruritus   Assessment and Plan:  * Diverticulitis With microperforation Aggressive IV antibiotic at this time, metronidazole  500 mg IV twice daily, ceftriaxone  2 g IV daily to complete 7-day course  Pruritus Of forehead With IV opioid medication  As needed diphenhydramine  12.5 mg IV every 6 hours.  For itching, sleep, allergies, 1 day ordered  Sepsis [facility]) Patient meets sepsis criteria with severe sepsis WBC 21, increased respiration rate, heart rate, and lactic acid uptrending to 2.3 We will recheck a third lactic acid to ensure downtrending Continue with IV antibiotic per above: Metronidazole , ceftriaxone  2 g IV daily Status post aggressive fluid hydration to complete sepsis bolus based on ideal body weight Continue with sodium chloride  infusion at 125 mL/h Maintain MAP > 65  Sinus tachycardia Suspect secondary to complicated diverticulitis causing pain Symptomatic support per above Metronidazole  500 mg IV every 4 hours as needed for heart rate greater than 120, 3 doses ordered  History of pancreatitis Status post  cholecystectomy  Abdominal pain Secondary to complex diverticulitis Symptomatic support: Morphine  4 mg IV every 4 hours as needed for moderate pain, for 1  day ordered; Dilaudid  0.5 mg IV every 4 hours as needed for severe pain, 1 day ordered  Morbid obesity with BMI of 50.0-59.9, adult (HCC) This complicates overall care and prognosis.   Chart reviewed.   DVT prophylaxis: Heparin  5000 units subcutaneous every 8 hours Code Status: Full code Diet: N.p.o. except for sips with meds and ice chips, advance as tolerated to clear liquid diet Family Communication: No Disposition Plan: Pending clinical course Consults called: General Surgery Admission status: PCU, inpatient  Past Medical History:  Diagnosis Date   Pancreatitis    Past Surgical History:  Procedure Laterality Date   CHOLECYSTECTOMY     Social History:  reports that she has never smoked. She has never used smokeless tobacco. She reports that she does not drink alcohol and does not use drugs.  No Known Allergies Family History  Problem Relation Age of Onset   Hypertension Mother    Family history: Family history reviewed and not pertinent.  Prior to Admission medications   Not on File   Physical Exam: Vitals:   12/29/23 1317 12/29/23 1318 12/29/23 1723  BP: (!) 143/88  (!) 152/80  Pulse: (!) 109  (!) 135  Resp: 18  18  Temp: 97.8 F (36.6 C)  98.4 F (36.9 C)  TempSrc: Oral  Oral  SpO2: 96%  97%  Weight:  136.1 kg   Height:  5' 4 (1.626 m)    Constitutional: appears older than chronological age, mildly uncomfortable, calm Eyes: PERRL, lids and conjunctivae normal ENMT: Mucous membranes are moist. Posterior pharynx clear of any exudate or lesions. Age-appropriate dentition. Hearing appropriate Neck: normal, supple, no masses, no thyromegaly Respiratory: clear to auscultation bilaterally, no wheezing, no crackles. Normal respiratory effort. No accessory muscle use.  Cardiovascular: Regular rate and rhythm, no murmurs / rubs / gallops. No extremity edema. 2+ pedal pulses. No carotid bruits.  Abdomen: Morbidly obese abdomen, no tenderness, no masses palpated, no  hepatosplenomegaly. Bowel sounds positive.  Musculoskeletal: no clubbing / cyanosis. No joint deformity upper and lower extremities. Good ROM, no contractures, no atrophy. Normal muscle tone.  Skin: no rashes, lesions, ulcers. No induration Neurologic: Sensation intact. Strength 5/5 in all 4.  Psychiatric: Normal judgment and insight. Alert and oriented x 3. Normal mood.   EKG: independently reviewed, showing sinus tachycardia with rate of 112, QTc 458  Chest x-ray on Admission: I personally reviewed and I agree with radiologist reading as below.  CT ABDOMEN PELVIS W CONTRAST Addendum Date: 12/29/2023 ADDENDUM REPORT: 12/29/2023 17:12 ADDENDUM: Critical Value/emergent results were called by telephone at the time of interpretation on 12/29/2023 at 5:11 pm to provider Dr. Waymond, who verbally acknowledged these results. Electronically Signed   By: Leita Birmingham M.D.   On: 12/29/2023 17:12   Result Date: 12/29/2023 CLINICAL DATA:  Abdominal pain, acute, nonlocalized. EXAM: CT ABDOMEN AND PELVIS WITH CONTRAST TECHNIQUE: Multidetector CT imaging of the abdomen and pelvis was performed using the standard protocol following bolus administration of intravenous contrast. RADIATION DOSE REDUCTION: This exam was performed according to the departmental dose-optimization program which includes automated exposure control, adjustment of the mA and/or kV according to patient size and/or use of iterative reconstruction technique. CONTRAST:  OMNIPAQUE  IOHEXOL  300 MG/ML  SOLN COMPARISON:  01/18/2008. FINDINGS: Lower chest: The heart is normal in size and there is a trace pericardial effusion. Minimal  atelectasis is noted at the lung bases. Hepatobiliary: No focal abnormality in the liver. Fatty infiltration of the liver is noted. The gallbladder is surgically absent. No biliary ductal dilatation. Pancreas: Unremarkable. No pancreatic ductal dilatation or surrounding inflammatory changes. Spleen: Normal in size without  focal abnormality. Adrenals/Urinary Tract: The adrenal glands are within normal limits. The kidneys enhance normally. No renal calculus or hydronephrosis bilaterally. The bladder is unremarkable. Stomach/Bowel: The stomach is within normal limits. There is colonic diverticulosis with pericolonic fat stranding about the sigmoid colon. Suspected tiny foci of air are identified along the sigmoid colon, best seen on axial image 66 suggesting micro perforation. No abscess is seen. Appendix appears normal. No obstruction or pneumatosis. Vascular/Lymphatic: Aortic atherosclerosis. No abdominal or pelvic lymphadenopathy by size criteria. Reproductive: Uterus and bilateral adnexa are unremarkable. Other: There is a trace amount of free fluid adjacent to the sigmoid colon on the left. Musculoskeletal: Degenerative changes are present in the thoracolumbar spine. No acute osseous abnormality. IMPRESSION: 1. Acute sigmoid diverticulitis with a trace amount of free air suggesting micro perforation. No abscess is seen. 2. Hepatic steatosis. 3. Aortic atherosclerosis. Electronically Signed: By: Leita Birmingham M.D. On: 12/29/2023 17:06   Labs on Admission: I have personally reviewed following labs  CBC: Recent Labs  Lab 12/29/23 1321  WBC 21.0*  NEUTROABS 17.9*  HGB 14.6  HCT 45.0  MCV 80.5  PLT 354   Basic Metabolic Panel: Recent Labs  Lab 12/29/23 1321  NA 139  K 3.8  CL 105  CO2 21*  GLUCOSE 134*  BUN 16  CREATININE 0.77  CALCIUM 9.9  MG 1.9  PHOS 3.3   GFR: Estimated Creatinine Clearance: 103.1 mL/min (by C-G formula based on SCr of 0.77 mg/dL).  Liver Function Tests: Recent Labs  Lab 12/29/23 1321  AST 26  ALT 20  ALKPHOS 77  BILITOT 0.6  PROT 8.0  ALBUMIN 4.2   Recent Labs  Lab 12/29/23 1321  LIPASE 32   Urine analysis:    Component Value Date/Time   COLORURINE YELLOW (A) 12/29/2023 1321   APPEARANCEUR CLEAR (A) 12/29/2023 1321   LABSPEC 1.018 12/29/2023 1321   PHURINE 6.0  12/29/2023 1321   GLUCOSEU NEGATIVE 12/29/2023 1321   HGBUR NEGATIVE 12/29/2023 1321   BILIRUBINUR NEGATIVE 12/29/2023 1321   KETONESUR NEGATIVE 12/29/2023 1321   PROTEINUR NEGATIVE 12/29/2023 1321   NITRITE NEGATIVE 12/29/2023 1321   LEUKOCYTESUR NEGATIVE 12/29/2023 1321   CRITICAL CARE Performed by: Dr. Sherre  Total critical care time: 32 minutes  Critical care time was exclusive of separately billable procedures and treating other patients.  Critical care was necessary to treat or prevent imminent or life-threatening deterioration.  Critical care was time spent personally by me on the following activities: development of treatment plan with patient as well as nursing, discussions with consultants, evaluation of patient's response to treatment, examination of patient, obtaining history from patient or surrogate, ordering and performing treatments and interventions, ordering and review of laboratory studies, ordering and review of radiographic studies, pulse oximetry and re-evaluation of patient's condition.  This document was prepared using Dragon Voice Recognition software and may include unintentional dictation errors.  Dr. Sherre [facility]  If 7PM-7AM, please contact overnight-coverage provider If 7AM-7PM, please contact day attending provider [email or web address]  12/29/2023, 7:22 PM

## 2023-12-29 NOTE — Assessment & Plan Note (Signed)
 Of forehead With IV opioid medication  As needed diphenhydramine  12.5 mg IV every 6 hours.  For itching, sleep, allergies, 1 day ordered

## 2023-12-29 NOTE — ED Provider Notes (Addendum)
 [facility] Provider Note    None    (approximate)   History   Abdominal Pain   HPI  Brandi Randolph is a 58 y.o. female with history of pancreatitis s/p cholecystectomy who comes in with concerns for lower abdominal pain.  Patient reports having some bloating and trying to eat healthy over the past 2 weeks.  Attributed to be eating more vegetables and trying to eat healthy but then yesterday she started having worsening lower abdominal pain more on the left side with some nausea.  She reports that occasionally the pain kind of goes up into her upper abdomen denies any chest pain, shortness of breath, history of blood clots.  Does report maybe a little bit of dysuria but denies any blood in her urine.   Physical Exam   Triage Vital Signs: ED Triage Vitals  Encounter Vitals Group     BP 12/29/23 1317 (!) 143/88     Girls Systolic BP Percentile --      Girls Diastolic BP Percentile --      Boys Systolic BP Percentile --      Boys Diastolic BP Percentile --      Pulse Rate 12/29/23 1317 (!) 109     Resp 12/29/23 1317 18     Temp 12/29/23 1317 97.8 F (36.6 C)     Temp Source 12/29/23 1317 Oral     SpO2 12/29/23 1317 96 %     Weight 12/29/23 1318 300 lb (136.1 kg)     Height 12/29/23 1318 5' 4 (1.626 m)     Head Circumference --      Peak Flow --      Pain Score 12/29/23 1318 7     Pain Loc --      Pain Education --      Exclude from Growth Chart --     Most recent vital signs: Vitals:   12/29/23 1317  BP: (!) 143/88  Pulse: (!) 109  Resp: 18  Temp: 97.8 F (36.6 C)  SpO2: 96%     General: Awake, no distress.  CV:  Good peripheral perfusion.  Tachycardic Resp:  Normal effort.  Abd:  No distention.  Tender in the left lower quadrant without any rebound, guarding. Other:  No swelling no calf tenderness   ED Results / Procedures / Treatments   Labs (all labs ordered are listed, but only abnormal results are displayed) Labs Reviewed   COMPREHENSIVE METABOLIC PANEL WITH GFR - Abnormal; Notable for the following components:      Result Value   CO2 21 (*)    Glucose, Bld 134 (*)    All other components within normal limits  URINALYSIS, ROUTINE W REFLEX MICROSCOPIC - Abnormal; Notable for the following components:   Color, Urine YELLOW (*)    APPearance CLEAR (*)    All other components within normal limits  CBC WITH DIFFERENTIAL/PLATELET - Abnormal; Notable for the following components:   WBC 21.0 (*)    RBC 5.59 (*)    Neutro Abs 17.9 (*)    Abs Immature Granulocytes 0.12 (*)    All other components within normal limits  LIPASE, BLOOD     EKG  My interpretation of EKG:  Sinus tachycardia rate of 112 without any ST elevation or T wave inversions, normal intervals  RADIOLOGY Pending    PROCEDURES:  Critical Care performed: No  .1-3 Lead EKG Interpretation  Performed by: Ernest Ronal BRAVO, MD Authorized by: Ernest Ronal BRAVO,  MD     Interpretation: abnormal     ECG rate:  120   ECG rate assessment: tachycardic     Rhythm: sinus tachycardia     Ectopy: none     Conduction: normal   .Critical Care  Performed by: Ernest Ronal BRAVO, MD Authorized by: Ernest Ronal BRAVO, MD   Critical care provider statement:    Critical care time (minutes):  30   Critical care was necessary to treat or prevent imminent or life-threatening deterioration of the following conditions:  Sepsis   Critical care was time spent personally by me on the following activities:  Development of treatment plan with patient or surrogate, discussions with consultants, evaluation of patient's response to treatment, examination of patient, ordering and review of laboratory studies, ordering and review of radiographic studies, ordering and performing treatments and interventions, pulse oximetry, re-evaluation of patient's condition and review of old charts    MEDICATIONS ORDERED IN ED: Medications  lactated ringers  bolus 1,000 mL (has no administration  in time range)    And  lactated ringers  bolus 800 mL (has no administration in time range)  ceFEPIme  (MAXIPIME ) 2 g in sodium chloride  0.9 % 100 mL IVPB (has no administration in time range)  metroNIDAZOLE  (FLAGYL ) IVPB 500 mg (has no administration in time range)  vancomycin  (VANCOCIN ) IVPB 1000 mg/200 mL premix (has no administration in time range)  ondansetron  (ZOFRAN ) injection 4 mg (has no administration in time range)  HYDROmorphone  (DILAUDID ) injection 0.5 mg (has no administration in time range)     IMPRESSION / MDM / ASSESSMENT AND PLAN / ED COURSE  I reviewed the triage vital signs and the nursing notes.   Patient's presentation is most consistent with acute presentation with potential threat to life or bodily function.   Patient comes in with concerns for lower abdominal discomfort patient is very tachycardic will get EKG, cardiac markers given she did report some epigastric pain but I suspect this is more likely intra-abdominal in nature.  She denies any chest pain to suggest dissection no shortness of breath to suggest PE.  Will proceed with fluid resuscitation for ideal body weight give broad-spectrum antibiotics due to concerns for intra-abdominal infection and treat patient's pain with Dilaudid , Zofran .    Given elevated heart rate with elevated white count patient met sepsis criteria.  Lipase is normal CMP reassuring urine without UTI  Patient will be handed off to oncoming team pending CT imaging.  The patient is on the cardiac monitor to evaluate for evidence of arrhythmia and/or significant heart rate changes.      FINAL CLINICAL IMPRESSION(S) / ED DIAGNOSES   Final diagnoses:  Sepsis, due to unspecified organism, unspecified whether acute organ dysfunction present (HCC)  Abdominal pain, unspecified abdominal location     Rx / DC Orders   ED Discharge Orders     None        Note:  This document was prepared using Dragon voice recognition software and  may include unintentional dictation errors.   Ernest Ronal BRAVO, MD 12/29/23 1511    Ernest Ronal BRAVO, MD 12/29/23 [phone]

## 2023-12-29 NOTE — Assessment & Plan Note (Signed)
 With microperforation Aggressive IV antibiotic at this time, metronidazole  500 mg IV twice daily, ceftriaxone  2 g IV daily to complete 7-day course

## 2023-12-30 ENCOUNTER — Encounter: Payer: Self-pay | Admitting: Family Medicine

## 2023-12-30 DIAGNOSIS — K5720 Diverticulitis of large intestine with perforation and abscess without bleeding: Secondary | ICD-10-CM

## 2023-12-30 DIAGNOSIS — R000 Tachycardia, unspecified: Secondary | ICD-10-CM

## 2023-12-30 DIAGNOSIS — R1032 Left lower quadrant pain: Secondary | ICD-10-CM

## 2023-12-30 LAB — CBC
HCT: 40.3 % (ref 36.0–46.0)
Hemoglobin: 12.5 g/dL (ref 12.0–15.0)
MCH: 25.7 pg — ABNORMAL LOW (ref 26.0–34.0)
MCHC: 31 g/dL (ref 30.0–36.0)
MCV: 82.8 fL (ref 80.0–100.0)
Platelets: 312 K/uL (ref 150–400)
RBC: 4.87 MIL/uL (ref 3.87–5.11)
RDW: 13.9 % (ref 11.5–15.5)
WBC: 16 K/uL — ABNORMAL HIGH (ref 4.0–10.5)
nRBC: 0 % (ref 0.0–0.2)

## 2023-12-30 LAB — BASIC METABOLIC PANEL WITH GFR
Anion gap: 8 (ref 5–15)
BUN: 11 mg/dL (ref 6–20)
CO2: 23 mmol/L (ref 22–32)
Calcium: 8.5 mg/dL — ABNORMAL LOW (ref 8.9–10.3)
Chloride: 108 mmol/L (ref 98–111)
Creatinine, Ser: 0.71 mg/dL (ref 0.44–1.00)
GFR, Estimated: >60 mL/min (ref >60)
Glucose, Bld: 108 mg/dL — ABNORMAL HIGH (ref 70–99)
Potassium: 4.1 mmol/L (ref 3.5–5.1)
Sodium: 139 mmol/L (ref 135–145)

## 2023-12-30 MED ORDER — FAMOTIDINE IN NACL 20-0.9 MG/50ML-% IV SOLN
20.0000 mg | Freq: Two times a day (BID) | INTRAVENOUS | Status: DC
  Administered 2023-12-30 – 2023-12-31 (×4): 20 mg via INTRAVENOUS
  Filled 2023-12-30 (×6): qty 50

## 2023-12-30 MED ORDER — MORPHINE SULFATE (PF) 2 MG/ML IV SOLN
2.0000 mg | INTRAVENOUS | Status: DC | PRN
  Administered 2023-12-31 (×2): 2 mg via INTRAVENOUS
  Filled 2023-12-30: qty 1

## 2023-12-30 NOTE — Progress Notes (Signed)
 PROGRESS NOTE    Brandi Randolph  FMW:[id number] DOB: 12-Nov-1965 DOA: 12/29/2023 PCP: Pcp, No  Chief Complaint  Patient presents with   Abdominal Pain    Hospital Course:  Brandi Randolph is a 58 year old female with pancreatitis s/p cholecystectomy, class III obesity, who presents to the ED with abdominal pain.  On arrival patient was tachycardic, WBC 21, lactic acid 2.1.  CT scan revealed diverticulitis with microperforation.  She was started on antibiotic therapy, general surgery was consulted, and she was admitted for further evaluation  Subjective: This morning she reports abdominal pain is improving some.  Denies any appetite at this time.  Does report she was told she had apneic episodes overnight and she is on oxygen for this currently but does not typically use oxygen at home.   Objective: Vitals:   12/30/23 0657 12/30/23 0657 12/30/23 0958 12/30/23 0959  BP:   130/65   Pulse:   95   Resp:   17   Temp:    98.4 F (36.9 C)  TempSrc:    Oral  SpO2: (!) 86% 95% 98%   Weight:      Height:        Intake/Output Summary (Last 24 hours) at 12/30/2023 1545 Last data filed at 12/30/2023 1517 Gross per 24 hour  Intake 2216.23 ml  Output --  Net 2216.23 ml   Filed Weights   12/29/23 1318  Weight: 136.1 kg    Examination: General exam: Appears calm and comfortable, NAD  Respiratory system: No work of breathing, symmetric chest wall expansion Cardiovascular system: S1 & S2 heard, RRR.  Gastrointestinal system: Abdomen is nondistended, soft and nontender.  Neuro: Alert and oriented. No focal neurological deficits. Extremities: Symmetric, expected ROM Skin: No rashes, lesions Psychiatry: Demonstrates appropriate judgement and insight. Mood & affect appropriate for situation.   Assessment & Plan:  Principal Problem:   Diverticulitis Active Problems:   Morbid obesity with BMI of 50.0-59.9, adult (HCC)   Abdominal pain   History of pancreatitis   Sinus tachycardia    Sepsis (HCC)   Pruritus    Diverticulitis with microperforation - Continue with complete bowel rest at this time - WBC downtrending - Continue with ceftriaxone  and Flagyl  - General Surgery consulted and agrees with the above - Will start with clear liquids tomorrow and advance slowly - Continue with IV pain management for now - Repeat labs in a.m.  Class III obesity - Outpatient follow up for lifestyle modification and risk factor management  Nighttime apneic and hypoxia events - As noted by overnight ED RN - Likely undiagnosed sleep apnea - Will need sleep study referral outpatient - For now continue with O2 at night.  She is very stable on room air when awake  DVT prophylaxis: heparin    Code Status: Full Code Disposition:  Inpatient pending clinical resolution  Consultants:  Treatment Team:  Consulting Physician: Lane Shope, MD  Procedures:  N/a  Antimicrobials:  Anti-infectives (From admission, onward)    Start     Dose/Rate Route Frequency Ordered Stop   12/30/23 0500  metroNIDAZOLE  (FLAGYL ) IVPB 500 mg        500 mg 100 mL/hr over 60 Minutes Intravenous Every 12 hours 12/29/23 1755 01/06/24 0459   12/29/23 1745  cefTRIAXone  (ROCEPHIN ) 2 g in sodium chloride  0.9 % 100 mL IVPB        2 g 200 mL/hr over 30 Minutes Intravenous Every 24 hours 12/29/23 1739 01/05/24 1744   12/29/23 1745  metroNIDAZOLE  (FLAGYL ) IVPB  500 mg  Status:  Discontinued        500 mg 100 mL/hr over 60 Minutes Intravenous Every 12 hours 12/29/23 1739 12/29/23 1755   12/29/23 1415  ceFEPIme  (MAXIPIME ) 2 g in sodium chloride  0.9 % 100 mL IVPB        2 g 200 mL/hr over 30 Minutes Intravenous  Once 12/29/23 1412 12/29/23 1538   12/29/23 1415  metroNIDAZOLE  (FLAGYL ) IVPB 500 mg        500 mg 100 mL/hr over 60 Minutes Intravenous  Once 12/29/23 1412 12/29/23 1719   12/29/23 1415  vancomycin  (VANCOCIN ) IVPB 1000 mg/200 mL premix        1,000 mg 200 mL/hr over 60 Minutes Intravenous  Once  12/29/23 1412 12/29/23 1842       Data Reviewed: I have personally reviewed following labs and imaging studies CBC: Recent Labs  Lab 12/29/23 1321 12/30/23 0552  WBC 21.0* 16.0*  NEUTROABS 17.9*  --   HGB 14.6 12.5  HCT 45.0 40.3  MCV 80.5 82.8  PLT 354 312   Basic Metabolic Panel: Recent Labs  Lab 12/29/23 1321 12/30/23 0406  NA 139 139  K 3.8 4.1  CL 105 108  CO2 21* 23  GLUCOSE 134* 108*  BUN 16 11  CREATININE 0.77 0.71  CALCIUM 9.9 8.5*  MG 1.9  --   PHOS 3.3  --    GFR: Estimated Creatinine Clearance: 103.1 mL/min (by C-G formula based on SCr of 0.71 mg/dL). Liver Function Tests: Recent Labs  Lab 12/29/23 1321  AST 26  ALT 20  ALKPHOS 77  BILITOT 0.6  PROT 8.0  ALBUMIN 4.2   CBG: No results for input(s): GLUCAP in the last 168 hours.  Recent Results (from the past 240 hours)  Blood culture (routine x 2)     Status: None (Preliminary result)   Collection Time: 12/29/23  2:46 PM   Specimen: BLOOD  Result Value Ref Range Status   Specimen Description BLOOD BLOOD RIGHT FOREARM  Final   Special Requests   Final    BOTTLES DRAWN AEROBIC AND ANAEROBIC Blood Culture adequate volume   Culture   Final    NO GROWTH < 24 HOURS Performed at [facility], [address] [place], [place] [address]    Report Status PENDING  Incomplete  Blood culture (routine x 2)     Status: None (Preliminary result)   Collection Time: 12/29/23  2:47 PM   Specimen: BLOOD  Result Value Ref Range Status   Specimen Description BLOOD LEFT ANTECUBITAL  Final   Special Requests   Final    BOTTLES DRAWN AEROBIC AND ANAEROBIC Blood Culture results may not be optimal due to an inadequate volume of blood received in culture bottles   Culture   Final    NO GROWTH < 24 HOURS Performed at [facility], [address] [place], [place] [address]    Report Status PENDING  Incomplete     Radiology Studies: CT ABDOMEN PELVIS W CONTRAST Addendum Date:  12/29/2023 ADDENDUM REPORT: 12/29/2023 17:12 ADDENDUM: Critical Value/emergent results were called by telephone at the time of interpretation on 12/29/2023 at 5:11 pm to provider Dr. Waymond, who verbally acknowledged these results. Electronically Signed   By: Leita Birmingham M.D.   On: 12/29/2023 17:12   Result Date: 12/29/2023 CLINICAL DATA:  Abdominal pain, acute, nonlocalized. EXAM: CT ABDOMEN AND PELVIS WITH CONTRAST TECHNIQUE: Multidetector CT imaging of the abdomen and pelvis was performed using the  standard protocol following bolus administration of intravenous contrast. RADIATION DOSE REDUCTION: This exam was performed according to the departmental dose-optimization program which includes automated exposure control, adjustment of the mA and/or kV according to patient size and/or use of iterative reconstruction technique. CONTRAST:  OMNIPAQUE  IOHEXOL  300 MG/ML  SOLN COMPARISON:  01/18/2008. FINDINGS: Lower chest: The heart is normal in size and there is a trace pericardial effusion. Minimal atelectasis is noted at the lung bases. Hepatobiliary: No focal abnormality in the liver. Fatty infiltration of the liver is noted. The gallbladder is surgically absent. No biliary ductal dilatation. Pancreas: Unremarkable. No pancreatic ductal dilatation or surrounding inflammatory changes. Spleen: Normal in size without focal abnormality. Adrenals/Urinary Tract: The adrenal glands are within normal limits. The kidneys enhance normally. No renal calculus or hydronephrosis bilaterally. The bladder is unremarkable. Stomach/Bowel: The stomach is within normal limits. There is colonic diverticulosis with pericolonic fat stranding about the sigmoid colon. Suspected tiny foci of air are identified along the sigmoid colon, best seen on axial image 66 suggesting micro perforation. No abscess is seen. Appendix appears normal. No obstruction or pneumatosis. Vascular/Lymphatic: Aortic atherosclerosis. No abdominal or pelvic  lymphadenopathy by size criteria. Reproductive: Uterus and bilateral adnexa are unremarkable. Other: There is a trace amount of free fluid adjacent to the sigmoid colon on the left. Musculoskeletal: Degenerative changes are present in the thoracolumbar spine. No acute osseous abnormality. IMPRESSION: 1. Acute sigmoid diverticulitis with a trace amount of free air suggesting micro perforation. No abscess is seen. 2. Hepatic steatosis. 3. Aortic atherosclerosis. Electronically Signed: By: Leita Birmingham M.D. On: 12/29/2023 17:06    Scheduled Meds:  heparin   5,000 Units Subcutaneous Q8H   Continuous Infusions:  sodium chloride  Stopped (12/30/23 1509)   cefTRIAXone  (ROCEPHIN )  IV Stopped (12/29/23 1948)   famotidine  (PEPCID ) IV 100 mL/hr at 12/30/23 1517   metronidazole  Stopped (12/30/23 0604)   promethazine  (PHENERGAN ) injection (IM or IVPB)       LOS: 1 day  MDM: Patient is high risk for one or more organ failure.  They necessitate ongoing hospitalization for continued IV therapies and subsequent lab monitoring. Total time spent interpreting labs and vitals, reviewing the medical record, coordinating care amongst consultants and care team members, directly assessing and discussing care with the patient and/or family: 55 min  Jammie Clink, DO [facility]  To contact the attending physician between 7A-7P please use Epic Chat. To contact the covering physician during after hours 7P-7A, please review Amion.  12/30/2023, 3:45 PM   *This document has been created with the assistance of dictation software. Please excuse typographical errors. *

## 2023-12-30 NOTE — ED Notes (Signed)
 Informed RN bed assigned

## 2023-12-30 NOTE — ED Notes (Signed)
 Assumed care of patient, pt resting in bed, responsive to verbal stimulation, no distress noted

## 2023-12-31 LAB — CBC WITH DIFFERENTIAL/PLATELET
Abs Immature Granulocytes: 0.05 K/uL (ref 0.00–0.07)
Basophils Absolute: 0.1 K/uL (ref 0.0–0.1)
Basophils Relative: 1 %
Eosinophils Absolute: 0.2 K/uL (ref 0.0–0.5)
Eosinophils Relative: 2 %
HCT: 39.8 % (ref 36.0–46.0)
Hemoglobin: 12.1 g/dL (ref 12.0–15.0)
Immature Granulocytes: 1 %
Lymphocytes Relative: 17 %
Lymphs Abs: 1.8 K/uL (ref 0.7–4.0)
MCH: 25.4 pg — ABNORMAL LOW (ref 26.0–34.0)
MCHC: 30.4 g/dL (ref 30.0–36.0)
MCV: 83.4 fL (ref 80.0–100.0)
Monocytes Absolute: 0.8 K/uL (ref 0.1–1.0)
Monocytes Relative: 7 %
Neutro Abs: 7.7 K/uL (ref 1.7–7.7)
Neutrophils Relative %: 72 %
Platelets: 286 K/uL (ref 150–400)
RBC: 4.77 MIL/uL (ref 3.87–5.11)
RDW: 13.6 % (ref 11.5–15.5)
WBC: 10.6 K/uL — ABNORMAL HIGH (ref 4.0–10.5)
nRBC: 0 % (ref 0.0–0.2)

## 2023-12-31 LAB — COMPREHENSIVE METABOLIC PANEL WITH GFR
ALT: 18 U/L (ref 0–44)
AST: 17 U/L (ref 15–41)
Albumin: 3.4 g/dL — ABNORMAL LOW (ref 3.5–5.0)
Alkaline Phosphatase: 58 U/L (ref 38–126)
Anion gap: 14 (ref 5–15)
BUN: 10 mg/dL (ref 6–20)
CO2: 24 mmol/L (ref 22–32)
Calcium: 8.9 mg/dL (ref 8.9–10.3)
Chloride: 102 mmol/L (ref 98–111)
Creatinine, Ser: 0.75 mg/dL (ref 0.44–1.00)
GFR, Estimated: >60 mL/min (ref >60)
Glucose, Bld: 91 mg/dL (ref 70–99)
Potassium: 4.1 mmol/L (ref 3.5–5.1)
Sodium: 140 mmol/L (ref 135–145)
Total Bilirubin: 0.8 mg/dL (ref 0.0–1.2)
Total Protein: 7.1 g/dL (ref 6.5–8.1)

## 2023-12-31 MED ORDER — ENOXAPARIN SODIUM 80 MG/0.8ML IJ SOSY
70.0000 mg | PREFILLED_SYRINGE | INTRAMUSCULAR | Status: DC
  Administered 2024-01-01 (×2): 70 mg via SUBCUTANEOUS
  Filled 2023-12-31 (×2): qty 0.7

## 2023-12-31 MED ORDER — LACTULOSE 10 GM/15ML PO SOLN
20.0000 g | Freq: Two times a day (BID) | ORAL | Status: DC
  Filled 2023-12-31 (×2): qty 30

## 2023-12-31 NOTE — Progress Notes (Signed)
 PT Cancellation Note  Patient Details Name: Brandi Randolph MRN: [id number] DOB: Jun 05, 1965   Cancelled Treatment:    Reason Eval/Treat Not Completed: PT screened, no needs identified, will sign off Pt up in room, fully dressed, IND with mobility. Pt able to use bathroom independently. Denied recent falls or concerns of balance/strength, returned to baseline PLOF. No acute PT needs, PT to sign off at this time.    Jerene Yeager, SPT

## 2023-12-31 NOTE — Progress Notes (Signed)
 PROGRESS NOTE    Brandi Randolph  FMW:[id number] DOB: Jun 13, 1965 DOA: 12/29/2023 PCP: Pcp, No  Chief Complaint  Patient presents with   Abdominal Pain    Hospital Course:  Brandi Randolph is a 58 year old female with pancreatitis s/p cholecystectomy, class III obesity, who presents to the ED with abdominal pain.  On arrival patient was tachycardic, WBC 21, lactic acid 2.1.  CT scan revealed diverticulitis with microperforation.  She was started on antibiotic therapy, general surgery was consulted, and she was admitted for further evaluation  Subjective: Patient continues to report her abdominal pain is improving.  She does have intermittent moments of abdominal pain.  She did have a bowel movement last night and reports that this has provided significant relief.  She is tolerating clear liquid diet but going slow.  Objective: Vitals:   12/30/23 2012 12/31/23 0415 12/31/23 0746 12/31/23 1453  BP: (!) 140/95 (!) 137/99 122/62 (!) 161/96  Pulse: 97  (!) 101 99  Resp: 16 18 16 16   Temp: 97.8 F (36.6 C) 99.3 F (37.4 C) 98.8 F (37.1 C) 98.3 F (36.8 C)  TempSrc: Oral Oral Oral Oral  SpO2: 96% 92% 92% 96%  Weight:      Height:        Intake/Output Summary (Last 24 hours) at 12/31/2023 1632 Last data filed at 12/31/2023 1339 Gross per 24 hour  Intake 860 ml  Output --  Net 860 ml   Filed Weights   12/29/23 1318  Weight: 136.1 kg    Examination: General exam: Appears calm and comfortable, NAD  Respiratory system: No work of breathing, symmetric chest wall expansion Cardiovascular system: S1 & S2 heard, RRR.  Gastrointestinal system: Abdomen is nondistended, soft and nontender.  Neuro: Alert and oriented. No focal neurological deficits. Extremities: Symmetric, expected ROM Skin: No rashes, lesions Psychiatry: Demonstrates appropriate judgement and insight. Mood & affect appropriate for situation.   Assessment & Plan:  Principal Problem:   Diverticulitis Active  Problems:   Morbid obesity with BMI of 50.0-59.9, adult (HCC)   Abdominal pain   History of pancreatitis   Sinus tachycardia   Sepsis (HCC)   Pruritus    Diverticulitis with microperforation - WBC continues to downtrend - Continue ceftriaxone  and Flagyl  - Tolerating CLD well, will advance to full liquid diet tonight - Continue with as needed IV pain management - Continue to trend labs - General Surgery consulted, appreciate further recommendations -Lactulose  for constipation  Class III obesity - Outpatient follow up for lifestyle modification and risk factor management  Nighttime apneic and hypoxia events - As noted by overnight ED RN - Likely undiagnosed sleep apnea - Will need sleep study referral outpatient - For now continue with O2 at night.  She is very stable on room air when awake  DVT prophylaxis: heparin    Code Status: Full Code Disposition:  Inpatient pending clinical resolution, she continues to improve may be able to discharge home tomorrow.  Consultants:  Treatment Team:  Consulting Physician: Lane Shope, MD  Procedures:  N/a  Antimicrobials:  Anti-infectives (From admission, onward)    Start     Dose/Rate Route Frequency Ordered Stop   12/30/23 0500  metroNIDAZOLE  (FLAGYL ) IVPB 500 mg        500 mg 100 mL/hr over 60 Minutes Intravenous Every 12 hours 12/29/23 1755 01/06/24 0459   12/29/23 1745  cefTRIAXone  (ROCEPHIN ) 2 g in sodium chloride  0.9 % 100 mL IVPB        2 g 200 mL/hr over  30 Minutes Intravenous Every 24 hours 12/29/23 1739 01/05/24 1744   12/29/23 1745  metroNIDAZOLE  (FLAGYL ) IVPB 500 mg  Status:  Discontinued        500 mg 100 mL/hr over 60 Minutes Intravenous Every 12 hours 12/29/23 1739 12/29/23 1755   12/29/23 1415  ceFEPIme  (MAXIPIME ) 2 g in sodium chloride  0.9 % 100 mL IVPB        2 g 200 mL/hr over 30 Minutes Intravenous  Once 12/29/23 1412 12/29/23 1538   12/29/23 1415  metroNIDAZOLE  (FLAGYL ) IVPB 500 mg        500  mg 100 mL/hr over 60 Minutes Intravenous  Once 12/29/23 1412 12/29/23 1719   12/29/23 1415  vancomycin  (VANCOCIN ) IVPB 1000 mg/200 mL premix        1,000 mg 200 mL/hr over 60 Minutes Intravenous  Once 12/29/23 1412 12/29/23 1842       Data Reviewed: I have personally reviewed following labs and imaging studies CBC: Recent Labs  Lab 12/29/23 1321 12/30/23 0552 12/31/23 0917  WBC 21.0* 16.0* 10.6*  NEUTROABS 17.9*  --  7.7  HGB 14.6 12.5 12.1  HCT 45.0 40.3 39.8  MCV 80.5 82.8 83.4  PLT 354 312 286   Basic Metabolic Panel: Recent Labs  Lab 12/29/23 1321 12/30/23 0406 12/31/23 0917  NA 139 139 140  K 3.8 4.1 4.1  CL 105 108 102  CO2 21* 23 24  GLUCOSE 134* 108* 91  BUN 16 11 10   CREATININE 0.77 0.71 0.75  CALCIUM 9.9 8.5* 8.9  MG 1.9  --   --   PHOS 3.3  --   --    GFR: Estimated Creatinine Clearance: 103.1 mL/min (by C-G formula based on SCr of 0.75 mg/dL). Liver Function Tests: Recent Labs  Lab 12/29/23 1321 12/31/23 0917  AST 26 17  ALT 20 18  ALKPHOS 77 58  BILITOT 0.6 0.8  PROT 8.0 7.1  ALBUMIN 4.2 3.4*   CBG: No results for input(s): GLUCAP in the last 168 hours.  Recent Results (from the past 240 hours)  Blood culture (routine x 2)     Status: None (Preliminary result)   Collection Time: 12/29/23  2:46 PM   Specimen: BLOOD  Result Value Ref Range Status   Specimen Description BLOOD BLOOD RIGHT FOREARM  Final   Special Requests   Final    BOTTLES DRAWN AEROBIC AND ANAEROBIC Blood Culture adequate volume   Culture   Final    NO GROWTH < 24 HOURS Performed at [facility], [address] [place], [place] [address]    Report Status PENDING  Incomplete  Blood culture (routine x 2)     Status: None (Preliminary result)   Collection Time: 12/29/23  2:47 PM   Specimen: BLOOD  Result Value Ref Range Status   Specimen Description BLOOD LEFT ANTECUBITAL  Final   Special Requests   Final    BOTTLES DRAWN AEROBIC AND ANAEROBIC Blood  Culture results may not be optimal due to an inadequate volume of blood received in culture bottles   Culture   Final    NO GROWTH < 24 HOURS Performed at [facility], [address], [place], [place] [address]    Report Status PENDING  Incomplete     Radiology Studies: CT ABDOMEN PELVIS W CONTRAST Addendum Date: 12/29/2023 ADDENDUM REPORT: 12/29/2023 17:12 ADDENDUM: Critical Value/emergent results were called by telephone at the time of interpretation on 12/29/2023 at 5:11 pm to provider Dr. Waymond, who  verbally acknowledged these results. Electronically Signed   By: Leita Birmingham M.D.   On: 12/29/2023 17:12   Result Date: 12/29/2023 CLINICAL DATA:  Abdominal pain, acute, nonlocalized. EXAM: CT ABDOMEN AND PELVIS WITH CONTRAST TECHNIQUE: Multidetector CT imaging of the abdomen and pelvis was performed using the standard protocol following bolus administration of intravenous contrast. RADIATION DOSE REDUCTION: This exam was performed according to the departmental dose-optimization program which includes automated exposure control, adjustment of the mA and/or kV according to patient size and/or use of iterative reconstruction technique. CONTRAST:  OMNIPAQUE  IOHEXOL  300 MG/ML  SOLN COMPARISON:  01/18/2008. FINDINGS: Lower chest: The heart is normal in size and there is a trace pericardial effusion. Minimal atelectasis is noted at the lung bases. Hepatobiliary: No focal abnormality in the liver. Fatty infiltration of the liver is noted. The gallbladder is surgically absent. No biliary ductal dilatation. Pancreas: Unremarkable. No pancreatic ductal dilatation or surrounding inflammatory changes. Spleen: Normal in size without focal abnormality. Adrenals/Urinary Tract: The adrenal glands are within normal limits. The kidneys enhance normally. No renal calculus or hydronephrosis bilaterally. The bladder is unremarkable. Stomach/Bowel: The stomach is within normal limits. There is colonic  diverticulosis with pericolonic fat stranding about the sigmoid colon. Suspected tiny foci of air are identified along the sigmoid colon, best seen on axial image 66 suggesting micro perforation. No abscess is seen. Appendix appears normal. No obstruction or pneumatosis. Vascular/Lymphatic: Aortic atherosclerosis. No abdominal or pelvic lymphadenopathy by size criteria. Reproductive: Uterus and bilateral adnexa are unremarkable. Other: There is a trace amount of free fluid adjacent to the sigmoid colon on the left. Musculoskeletal: Degenerative changes are present in the thoracolumbar spine. No acute osseous abnormality. IMPRESSION: 1. Acute sigmoid diverticulitis with a trace amount of free air suggesting micro perforation. No abscess is seen. 2. Hepatic steatosis. 3. Aortic atherosclerosis. Electronically Signed: By: Leita Birmingham M.D. On: 12/29/2023 17:06    Scheduled Meds:  enoxaparin  (LOVENOX ) injection  70 mg Subcutaneous Q24H   heparin   5,000 Units Subcutaneous Q8H   Continuous Infusions:  cefTRIAXone  (ROCEPHIN )  IV 2 g (12/30/23 1718)   famotidine  (PEPCID ) IV 20 mg (12/31/23 0919)   metronidazole  500 mg (12/31/23 0452)     LOS: 2 days  MDM: Patient is high risk for one or more organ failure.  They necessitate ongoing hospitalization for continued IV therapies and subsequent lab monitoring. Total time spent interpreting labs and vitals, reviewing the medical record, coordinating care amongst consultants and care team members, directly assessing and discussing care with the patient and/or family: 55 min  Brandi Duling, DO [facility]  To contact the attending physician between 7A-7P please use Epic Chat. To contact the covering physician during after hours 7P-7A, please review Amion.  12/31/2023, 4:32 PM   *This document has been created with the assistance of dictation software. Please excuse typographical errors. *

## 2023-12-31 NOTE — Progress Notes (Signed)
 CC: Diverticulitis Subjective: Patient doing well this morning.  She reports that she continues to have a little bit of pain in the left lower quadrant but it is improving.  She denies any nausea or vomiting.  She has been passing flatus but has not had a bowel movement yet.  She is hungry.  Objective: Vital signs in last 24 hours: Temp:  [97.8 F (36.6 C)-99.3 F (37.4 C)] 98.8 F (37.1 C) (08/12 0746) Pulse Rate:  [97-101] 101 (08/12 0746) Resp:  [14-18] 16 (08/12 0746) BP: (122-147)/(62-99) 122/62 (08/12 0746) SpO2:  [92 %-98 %] 92 % (08/12 0746) Last BM Date :  (PTA)  Intake/Output from previous day: 08/11 0701 - 08/12 0700 In: 2215.9 [I.V.:2063; IV Piggyback:152.9] Out: -  Intake/Output this shift: Total I/O In: 480 [P.O.:480] Out: -   Physical exam:  Sitting up in bed alert and oriented x 3, normal work of breathing room air, abdomen is soft, mild tenderness in the left lower quadrant without any rebound tenderness or guarding.  Lab Results: CBC  Recent Labs    12/30/23 0552 12/31/23 0917  WBC 16.0* 10.6*  HGB 12.5 12.1  HCT 40.3 39.8  PLT 312 286   BMET Recent Labs    12/30/23 0406 12/31/23 0917  NA 139 140  K 4.1 4.1  CL 108 102  CO2 23 24  GLUCOSE 108* 91  BUN 11 10  CREATININE 0.71 0.75  CALCIUM 8.5* 8.9   PT/INR No results for input(s): LABPROT, INR in the last 72 hours. ABG No results for input(s): PHART, HCO3 in the last 72 hours.  Invalid input(s): PCO2, PO2  Studies/Results: CT ABDOMEN PELVIS W CONTRAST Addendum Date: 12/29/2023 ADDENDUM REPORT: 12/29/2023 17:12 ADDENDUM: Critical Value/emergent results were called by telephone at the time of interpretation on 12/29/2023 at 5:11 pm to provider Dr. Waymond, who verbally acknowledged these results. Electronically Signed   By: Leita Birmingham M.D.   On: 12/29/2023 17:12   Result Date: 12/29/2023 CLINICAL DATA:  Abdominal pain, acute, nonlocalized. EXAM: CT ABDOMEN AND PELVIS WITH  CONTRAST TECHNIQUE: Multidetector CT imaging of the abdomen and pelvis was performed using the standard protocol following bolus administration of intravenous contrast. RADIATION DOSE REDUCTION: This exam was performed according to the departmental dose-optimization program which includes automated exposure control, adjustment of the mA and/or kV according to patient size and/or use of iterative reconstruction technique. CONTRAST:  OMNIPAQUE  IOHEXOL  300 MG/ML  SOLN COMPARISON:  01/18/2008. FINDINGS: Lower chest: The heart is normal in size and there is a trace pericardial effusion. Minimal atelectasis is noted at the lung bases. Hepatobiliary: No focal abnormality in the liver. Fatty infiltration of the liver is noted. The gallbladder is surgically absent. No biliary ductal dilatation. Pancreas: Unremarkable. No pancreatic ductal dilatation or surrounding inflammatory changes. Spleen: Normal in size without focal abnormality. Adrenals/Urinary Tract: The adrenal glands are within normal limits. The kidneys enhance normally. No renal calculus or hydronephrosis bilaterally. The bladder is unremarkable. Stomach/Bowel: The stomach is within normal limits. There is colonic diverticulosis with pericolonic fat stranding about the sigmoid colon. Suspected tiny foci of air are identified along the sigmoid colon, best seen on axial image 66 suggesting micro perforation. No abscess is seen. Appendix appears normal. No obstruction or pneumatosis. Vascular/Lymphatic: Aortic atherosclerosis. No abdominal or pelvic lymphadenopathy by size criteria. Reproductive: Uterus and bilateral adnexa are unremarkable. Other: There is a trace amount of free fluid adjacent to the sigmoid colon on the left. Musculoskeletal: Degenerative changes are present in  the thoracolumbar spine. No acute osseous abnormality. IMPRESSION: 1. Acute sigmoid diverticulitis with a trace amount of free air suggesting micro perforation. No abscess is seen. 2.  Hepatic steatosis. 3. Aortic atherosclerosis. Electronically Signed: By: Leita Birmingham M.D. On: 12/29/2023 17:06    Anti-infectives: Anti-infectives (From admission, onward)    Start     Dose/Rate Route Frequency Ordered Stop   12/30/23 0500  metroNIDAZOLE  (FLAGYL ) IVPB 500 mg        500 mg 100 mL/hr over 60 Minutes Intravenous Every 12 hours 12/29/23 1755 01/06/24 0459   12/29/23 1745  cefTRIAXone  (ROCEPHIN ) 2 g in sodium chloride  0.9 % 100 mL IVPB        2 g 200 mL/hr over 30 Minutes Intravenous Every 24 hours 12/29/23 1739 01/05/24 1744   12/29/23 1745  metroNIDAZOLE  (FLAGYL ) IVPB 500 mg  Status:  Discontinued        500 mg 100 mL/hr over 60 Minutes Intravenous Every 12 hours 12/29/23 1739 12/29/23 1755   12/29/23 1415  ceFEPIme  (MAXIPIME ) 2 g in sodium chloride  0.9 % 100 mL IVPB        2 g 200 mL/hr over 30 Minutes Intravenous  Once 12/29/23 1412 12/29/23 1538   12/29/23 1415  metroNIDAZOLE  (FLAGYL ) IVPB 500 mg        500 mg 100 mL/hr over 60 Minutes Intravenous  Once 12/29/23 1412 12/29/23 1719   12/29/23 1415  vancomycin  (VANCOCIN ) IVPB 1000 mg/200 mL premix        1,000 mg 200 mL/hr over 60 Minutes Intravenous  Once 12/29/23 1412 12/29/23 1842       Assessment/Plan: Patient with sigmoid diverticulitis with microperforation complicated by morbid obesity.  Overall she is doing well and her pain is improving.  I will advance her to a clear liquid diet today.  If she does good with this later tonight this evening she can have a full liquid diet.  Her labs have been reviewed and they are improving.  I encouraged her to walk about the floor.  A total of 35 minutes was spent reviewing the patient's chart performing a history and physical and discussing treatment options with the patient.   Jayson Endow, M.D. [facility]

## 2024-01-01 DIAGNOSIS — K5792 Diverticulitis of intestine, part unspecified, without perforation or abscess without bleeding: Secondary | ICD-10-CM

## 2024-01-01 MED ORDER — SODIUM CHLORIDE 0.9 % IV SOLN
2.0000 g | INTRAVENOUS | Status: DC
  Administered 2024-01-01 (×2): 2 g via INTRAVENOUS
  Filled 2024-01-01: qty 20

## 2024-01-01 NOTE — Progress Notes (Signed)
 CC: Diverticulitis Subjective: Patient doing well this morning.  She reports that she continues to have a little bit of pain in the left lower quadrant but continues to improve.  She denies any nausea or vomiting.  She has been passing flatus and is having some soft/liquid bowel movements.  She c/o bloating after eating.  Objective: Vital signs in last 24 hours: Temp:  [98.1 F (36.7 C)-98.8 F (37.1 C)] 98.1 F (36.7 C) (08/13 0401) Pulse Rate:  [82-106] 82 (08/13 0401) Resp:  [16-18] 18 (08/13 0401) BP: (113-161)/(62-96) 113/64 (08/13 0401) SpO2:  [92 %-96 %] 96 % (08/13 0401) Last BM Date :  (PTA)  Intake/Output from previous day: 08/12 0701 - 08/13 0700 In: 1100 [P.O.:1100] Out: -  Intake/Output this shift: No intake/output data recorded.  Physical exam:  Sitting up in bed alert and oriented x 3, normal work of breathing room air, abdomen is soft, mild tenderness in the left lower quadrant without any rebound tenderness or guarding.  Lab Results: CBC  Recent Labs    12/30/23 0552 12/31/23 0917  WBC 16.0* 10.6*  HGB 12.5 12.1  HCT 40.3 39.8  PLT 312 286   BMET Recent Labs    12/30/23 0406 12/31/23 0917  NA 139 140  K 4.1 4.1  CL 108 102  CO2 23 24  GLUCOSE 108* 91  BUN 11 10  CREATININE 0.71 0.75  CALCIUM 8.5* 8.9   PT/INR No results for input(s): LABPROT, INR in the last 72 hours. ABG No results for input(s): PHART, HCO3 in the last 72 hours.  Invalid input(s): PCO2, PO2  Studies/Results: No results found.   Anti-infectives: Anti-infectives (From admission, onward)    Start     Dose/Rate Route Frequency Ordered Stop   12/30/23 0500  metroNIDAZOLE  (FLAGYL ) IVPB 500 mg        500 mg 100 mL/hr over 60 Minutes Intravenous Every 12 hours 12/29/23 1755 01/06/24 0459   12/29/23 1745  cefTRIAXone  (ROCEPHIN ) 2 g in sodium chloride  0.9 % 100 mL IVPB        2 g 200 mL/hr over 30 Minutes Intravenous Every 24 hours 12/29/23 1739 01/05/24 1744    12/29/23 1745  metroNIDAZOLE  (FLAGYL ) IVPB 500 mg  Status:  Discontinued        500 mg 100 mL/hr over 60 Minutes Intravenous Every 12 hours 12/29/23 1739 12/29/23 1755   12/29/23 1415  ceFEPIme  (MAXIPIME ) 2 g in sodium chloride  0.9 % 100 mL IVPB        2 g 200 mL/hr over 30 Minutes Intravenous  Once 12/29/23 1412 12/29/23 1538   12/29/23 1415  metroNIDAZOLE  (FLAGYL ) IVPB 500 mg        500 mg 100 mL/hr over 60 Minutes Intravenous  Once 12/29/23 1412 12/29/23 1719   12/29/23 1415  vancomycin  (VANCOCIN ) IVPB 1000 mg/200 mL premix        1,000 mg 200 mL/hr over 60 Minutes Intravenous  Once 12/29/23 1412 12/29/23 1842       Assessment/Plan: Patient with sigmoid diverticulitis with microperforation complicated by morbid obesity.  Overall she is doing well and her pain is improving.  Agree with advanced diet, but feel it's prudent to observe her another day.  Her labs have been reviewed and they are improving.  I encouraged her to follow these fiber recommendations in recovery:   Advised to pursue a goal of 25 to 30 g of fiber daily.  The majority of this may be through natural sources, advised to  ensure a minimal daily fiber supplementation.  Various forms of supplements discussed.  Recommend Psyllium husk, with options of mixing with beverage or applesauce to make more tolerable. Strongly advised to consume more fluids(especially in proximity to fiber intake) and to ensure adequate hydration.   Watch color of urine to determine adequacy of hydration.  Clarity is pursued in urine output, and bowel activity that responds and corresponds to significant meal intake.   We need to avoid deferring having bowel movements, advised to take the time at the first sign of sensation, typically following meals, and in the morning.  The need to avoid more frequently, and the presence of flatus may indicate the need for bowel movement.  Do not defer for later.  Addition of MiraLAX (or its generic equivalent) may  be needed ensure at least twice daily bowel movements.  If multiple doses of MiraLAX are necessary utilize them. Never skip a day... Do not tolerate a day without a bowel movement unless you are fasting.  To be regular, we must do the above EVERY day.   Soluble Fiber Dissolves in Water: Soluble fiber dissolves in water to form a gel-like substance. Slows Digestion: This type of fiber slows down digestion, which can help control blood sugar levels and lower cholesterol. Sources: Common sources include oats, beans, apples, citrus fruits, and psyllium. Benefits: Helps manage cholesterol levels. Aids in blood sugar control. Increases healthy gut bacteria, which can lower inflammation and improve digestion.  Insoluble Fiber Does Not Dissolve in Water: Insoluble fiber does not dissolve in water and remains mostly intact as it passes through the digestive system. Adds Bulk to Stool: It adds bulk to stool, which helps promote regular bowel movements and prevent constipation. Sources: Common sources include whole grains, nuts, beans, and vegetables like cauliflower and potatoes. Benefits: Improves bowel health and regularity. Reduces the risk of colorectal conditions like hemorrhoids and diverticulitis. Supports insulin sensitivity in people with diabetes.  Both types of fiber are essential for overall health, and it's beneficial to include a 50/50 mix of both in your diet.    A total of 35 minutes was spent reviewing the patient's chart performing a history and physical and discussing treatment options with the patient.   Honor Leghorn, M.D., [facility]  01/01/2024 ; 7:46 AM

## 2024-01-01 NOTE — Discharge Instructions (Addendum)
   FIBER/FLUIDS  Advised to pursue a goal of 25 to 30 g of fiber daily.  The majority of this may be through natural sources, advised to ensure a minimal daily fiber supplementation.  Various forms of supplements discussed.  Recommend Psyllium husk, with options of mixing with beverage or applesauce to make more tolerable. Strongly advised to consume more fluids(especially in proximity to fiber intake) and to ensure adequate hydration.   Watch color of urine to determine adequacy of hydration.  Clarity is pursued in urine output, and bowel activity that responds and corresponds to significant meal intake.   We need to avoid deferring having bowel movements, advised to take the time at the first sign of sensation, typically following meals, and in the morning.  The need to avoid more frequently, and the presence of flatus may indicate the need for bowel movement.  Do not defer for later.  Addition of MiraLAX (or its generic equivalent) may be needed ensure at least twice daily bowel movements.  If multiple doses of MiraLAX are necessary utilize them. Never skip a day... Do not tolerate a day without a bowel movement unless you are fasting.  To be regular, we must do the above EVERY day.   Soluble Fiber Dissolves in Water: Soluble fiber dissolves in water to form a gel-like substance. Slows Digestion: This type of fiber slows down digestion, which can help control blood sugar levels and lower cholesterol. Sources: Common sources include oats, beans, apples, citrus fruits, and psyllium. Benefits: Helps manage cholesterol levels. Aids in blood sugar control. Increases healthy gut bacteria, which can lower inflammation and improve digestion.  Insoluble Fiber Does Not Dissolve in Water: Insoluble fiber does not dissolve in water and remains mostly intact as it passes through the digestive system. Adds Bulk to Stool: It adds bulk to stool, which helps promote regular bowel movements and prevent  constipation. Sources: Common sources include whole grains, nuts, beans, and vegetables like cauliflower and potatoes. Benefits: Improves bowel health and regularity. Reduces the risk of colorectal conditions like hemorrhoids and diverticulitis. Supports insulin sensitivity in people with diabetes.  Both types of fiber are essential for overall health, and it's beneficial to include a 50/50 mix of both in your diet.

## 2024-01-01 NOTE — Progress Notes (Signed)
  PROGRESS NOTE    Brandi Randolph  FMW:[id number] DOB: 01/06/1966 DOA: 12/29/2023 PCP: Pcp, No  [id number]  LOS: 3 days   Brief hospital course:   Assessment & Plan: Brandi Randolph is a 58 year old female with pancreatitis s/p cholecystectomy, class III obesity, who presents to the ED with abdominal pain.  On arrival patient was tachycardic, WBC 21, lactic acid 2.1.  CT scan revealed diverticulitis with microperforation.  She was started on antibiotic therapy, general surgery was consulted.   Diverticulitis with microperforation - WBC continues to downtrend --regular diet now --cont ceftriaxone  and Flagyl   Lactic acidosis, resolved   Class III obesity - Outpatient follow up for lifestyle modification and risk factor management   Nighttime apneic and hypoxia events - As noted by overnight ED RN - Likely undiagnosed sleep apnea - Will need sleep study referral outpatient - For now continue with O2 at night.  She is very stable on room air when awake   DVT prophylaxis: Lovenox  SQ Code Status: Full code  Family Communication:  Level of care: Telemetry Medical Dispo:   The patient is from: home Anticipated d/c is to: home Anticipated d/c date is: tomorrow   Subjective and Interval History:  Pt ate a regular meal, and felt bloated afterwards.  No overt pain.  Reported having diarrhea, not constipation.   Objective: Vitals:   01/01/24 0401 01/01/24 0817 01/01/24 1609 01/01/24 1955  BP: 113/64 (!) 144/107 (!) 145/116 (!) 160/99  Pulse: 82 95 (!) 109 (!) 107  Resp: 18 16 16 18   Temp: 98.1 F (36.7 C) 97.9 F (36.6 C) 98.2 F (36.8 C) 98.5 F (36.9 C)  TempSrc: Oral Oral Oral   SpO2: 96% 93% 94% 96%  Weight:      Height:        Intake/Output Summary (Last 24 hours) at 01/01/2024 2257 Last data filed at 01/01/2024 1325 Gross per 24 hour  Intake 480 ml  Output --  Net 480 ml   Filed Weights   12/29/23 1318  Weight: 136.1 kg    Examination:    Constitutional: NAD, AAOx3 HEENT: conjunctivae and lids normal, EOMI CV: No cyanosis.   RESP: normal respiratory effort, on RA Neuro: II - XII grossly intact.   Psych: Normal mood and affect.  Appropriate judgement and reason   Data Reviewed: I have personally reviewed labs and imaging studies  Time spent: 35 minutes  Brandi Haber, MD [facility] If 7PM-7AM, please contact night-coverage 01/01/2024, 10:57 PM

## 2024-01-01 NOTE — Plan of Care (Signed)

## 2024-01-01 NOTE — TOC CM/SW Note (Signed)
 Transition of Care [facility]) - Inpatient Brief Assessment   Patient Details  Name: Mahogani Holohan MRN: [id number] Date of Birth: 03/10/1966  Transition of Care [facility]) CM/SW Contact:    Asberry CHRISTELLA Jaksch, RN Phone Number: 01/01/2024, 3:24 PM   Clinical Narrative:  Transition of Care [facility]) Screening Note   Patient Details  Name: Myesha Stillion Date of Birth: 1966/05/06   Transition of Care [facility]) CM/SW Contact:    Asberry CHRISTELLA Jaksch, RN Phone Number: 01/01/2024, 3:24 PM    Transition of Care Department [facility]) has reviewed patient and no TOC needs have been identified at this time. If new patient transition needs arise, please place a TOC consult.   Patient has no PCP or insurance coverage. Resources added to AVS.  Transition of Care Asessment: Insurance and Status: Selfpay Patient has primary care physician: No (Resources added to AVS)     Prior/Current Home Services: No current home services Social Drivers of Health Review: SDOH reviewed no interventions necessary Readmission risk has been reviewed: Yes Transition of care needs: no transition of care needs at this time

## 2024-01-02 ENCOUNTER — Other Ambulatory Visit: Payer: Self-pay

## 2024-01-02 DIAGNOSIS — K5792 Diverticulitis of intestine, part unspecified, without perforation or abscess without bleeding: Secondary | ICD-10-CM | POA: Diagnosis not present

## 2024-01-02 DIAGNOSIS — Z6841 Body Mass Index (BMI) 40.0 and over, adult: Secondary | ICD-10-CM

## 2024-01-02 LAB — CBC
HCT: 40.1 % (ref 36.0–46.0)
Hemoglobin: 12.7 g/dL (ref 12.0–15.0)
MCH: 25.6 pg — ABNORMAL LOW (ref 26.0–34.0)
MCHC: 31.7 g/dL (ref 30.0–36.0)
MCV: 80.8 fL (ref 80.0–100.0)
Platelets: 321 K/uL (ref 150–400)
RBC: 4.96 MIL/uL (ref 3.87–5.11)
RDW: 13.6 % (ref 11.5–15.5)
WBC: 11 K/uL — ABNORMAL HIGH (ref 4.0–10.5)
nRBC: 0 % (ref 0.0–0.2)

## 2024-01-02 MED ORDER — SODIUM CHLORIDE 0.9 % IV SOLN
2.0000 g | INTRAVENOUS | Status: DC
  Administered 2024-01-02: 2 g via INTRAVENOUS
  Filled 2024-01-02: qty 20

## 2024-01-02 MED ORDER — AMOXICILLIN-POT CLAVULANATE 875-125 MG PO TABS
1.0000 | ORAL_TABLET | Freq: Two times a day (BID) | ORAL | 0 refills | Status: AC
  Filled 2024-01-02: qty 20, 10d supply, fill #0

## 2024-01-02 NOTE — Progress Notes (Signed)
 CC: Diverticulitis Subjective: Patient doing well this morning.  She reports that she continues to have a little bit of pain in the left lower quadrant but continues to improve.  She denies any nausea or vomiting.  She has been passing flatus and is having some soft/liquid bowel movements.  She c/o bloating after eating. She has some issues with her dog care at home, and is quite concerned about additional billing surrounding a follow-up CT scan, has opted to proceed home on oral antibiotics.  Objective: Vital signs in last 24 hours: Temp:  [97.9 F (36.6 C)-98.5 F (36.9 C)] 98.5 F (36.9 C) (08/13 1955) Pulse Rate:  [95-109] 107 (08/13 1955) Resp:  [16-18] 18 (08/13 1955) BP: (144-160)/(99-116) 160/99 (08/13 1955) SpO2:  [93 %-96 %] 96 % (08/13 1955) Last BM Date :  (PTA)  Intake/Output from previous day: 08/13 0701 - 08/14 0700 In: 480 [P.O.:480] Out: -  Intake/Output this shift: No intake/output data recorded.  Physical exam:  Sitting up in bed alert and oriented x 3, normal work of breathing room air, abdomen is soft, mild tenderness in the left lower quadrant without any rebound tenderness or guarding.  Lab Results: CBC  Recent Labs    12/31/23 0917 01/02/24 0308  WBC 10.6* 11.0*  HGB 12.1 12.7  HCT 39.8 40.1  PLT 286 321   BMET Recent Labs    12/31/23 0917  NA 140  K 4.1  CL 102  CO2 24  GLUCOSE 91  BUN 10  CREATININE 0.75  CALCIUM 8.9    Studies/Results: No results found.   Anti-infectives: Anti-infectives (From admission, onward)    Start     Dose/Rate Route Frequency Ordered Stop   01/01/24 1700  cefTRIAXone  (ROCEPHIN ) 2 g in sodium chloride  0.9 % 100 mL IVPB        2 g 200 mL/hr over 30 Minutes Intravenous Every 24 hours 01/01/24 1547 01/05/24 1659   12/30/23 0500  metroNIDAZOLE  (FLAGYL ) IVPB 500 mg        500 mg 100 mL/hr over 60 Minutes Intravenous Every 12 hours 12/29/23 1755 01/06/24 0459   12/29/23 1745  cefTRIAXone  (ROCEPHIN ) 2 g in  sodium chloride  0.9 % 100 mL IVPB  Status:  Discontinued        2 g 200 mL/hr over 30 Minutes Intravenous Every 24 hours 12/29/23 1739 01/01/24 1547   12/29/23 1745  metroNIDAZOLE  (FLAGYL ) IVPB 500 mg  Status:  Discontinued        500 mg 100 mL/hr over 60 Minutes Intravenous Every 12 hours 12/29/23 1739 12/29/23 1755   12/29/23 1415  ceFEPIme  (MAXIPIME ) 2 g in sodium chloride  0.9 % 100 mL IVPB        2 g 200 mL/hr over 30 Minutes Intravenous  Once 12/29/23 1412 12/29/23 1538   12/29/23 1415  metroNIDAZOLE  (FLAGYL ) IVPB 500 mg        500 mg 100 mL/hr over 60 Minutes Intravenous  Once 12/29/23 1412 12/29/23 1719   12/29/23 1415  vancomycin  (VANCOCIN ) IVPB 1000 mg/200 mL premix        1,000 mg 200 mL/hr over 60 Minutes Intravenous  Once 12/29/23 1412 12/29/23 1842       Assessment/Plan: Patient with sigmoid diverticulitis with microperforation complicated by morbid obesity.  Overall she is doing well and her pain is improving.   Her labs have been reviewed and they are stable.  I encouraged her to follow the fiber recommendations in recovery.  10 days of outpatient p.o.  Augmentin , along with probiotic.  And follow-up in a week.  I believe it is reasonable for her to be discharged at her wishes. Precautions discussed, and patient understands and accepts risks.  Repeat CT imaging may be desired if she fails to make the continued progress anticipated.  A total of 35 minutes was spent reviewing the patient's chart performing a history and physical and discussing treatment options with the patient.   Honor Leghorn, M.D., [facility]  01/02/2024 ; 7:39 AM

## 2024-01-02 NOTE — Plan of Care (Signed)
  Problem: Clinical Measurements: Goal: Diagnostic test results will improve Outcome: Progressing Goal: Signs and symptoms of infection will decrease Outcome: Progressing   Problem: Respiratory: Goal: Ability to maintain adequate ventilation will improve Outcome: Progressing   Problem: Clinical Measurements: Goal: Ability to maintain clinical measurements within normal limits will improve Outcome: Progressing Goal: Will remain free from infection Outcome: Progressing   Problem: Elimination: Goal: Will not experience complications related to bowel motility Outcome: Progressing   Problem: Pain Managment: Goal: General experience of comfort will improve and/or be controlled Outcome: Progressing

## 2024-01-02 NOTE — Discharge Summary (Signed)
 Physician Discharge Summary   Brandi Randolph  female DOB: 1966/02/04  FMW:[id number]  PCP: Pcp, No  Admit date: 12/29/2023 Discharge date: 01/02/2024  Admitted From: home Disposition:  home CODE STATUS: Full code  Discharge Instructions     Diet general   Complete by: As directed       Hospital Course:  For full details, please see H&P, progress notes, consult notes and ancillary notes.  Briefly,  Brandi Randolph is a 58 year old female with hx of pancreatitis s/p cholecystectomy, class III obesity, who presented to the ED with abdominal pain.    Diverticulitis with microperforation - CT scan revealed diverticulitis with microperforation.  She was started on antibiotic therapy, general surgery was consulted. --pt was tolerating regular diet prior to discharge. --Pt received 5 days of ceftriaxone  and flagyl , and discharged on 10 more days of Augmentin . --outpatient f/u with GenSurg.  Sepsis Sepsis (HCC) WBC 21, increased respiration rate, heart rate, and lactic acid elevation.   Class III obesity, BMI 51.47 - Outpatient follow up for lifestyle modification and risk factor management   Nighttime apneic and hypoxia events - As noted by overnight ED RN - Likely undiagnosed sleep apnea - sleep study outpatient    Discharge Diagnoses:  Principal Problem:   Diverticulitis Active Problems:   Morbid obesity with BMI of 50.0-59.9, adult (HCC)   Abdominal pain   History of pancreatitis   Sinus tachycardia   Sepsis (HCC)   Pruritus   30 Day Unplanned Readmission Risk Score    Flowsheet Row ED to Hosp-Admission (Current) from 12/29/2023 in [facility]  30 Day Unplanned Readmission Risk Score (%) 7.95 Filed at 01/02/2024 0801    This score is the patient's risk of an unplanned readmission within 30 days of being discharged (0 -100%). The score is based on dignosis, age, lab data, medications, orders, and past utilization.   Low:   0-14.9   Medium: 15-21.9   High: 22-29.9   Extreme: 30 and above         Discharge Instructions:  Allergies as of 01/02/2024   No Known Allergies      Medication List     TAKE these medications    amoxicillin -clavulanate 875-125 MG tablet Commonly known as: AUGMENTIN  Take 1 tablet by mouth 2 (two) times daily for 10 days.         Follow-up Information     Lane Shope, MD Follow up in 1 week(s).   Specialty: General Surgery Contact information: [address] [place] [address] [phone]                 No Known Allergies   The results of significant diagnostics from this hospitalization (including imaging, microbiology, ancillary and laboratory) are listed below for reference.   Consultations:   Procedures/Studies: CT ABDOMEN PELVIS W CONTRAST Addendum Date: 12/29/2023 ADDENDUM REPORT: 12/29/2023 17:12 ADDENDUM: Critical Value/emergent results were called by telephone at the time of interpretation on 12/29/2023 at 5:11 pm to provider Dr. Waymond, who verbally acknowledged these results. Electronically Signed   By: Leita Birmingham M.D.   On: 12/29/2023 17:12   Result Date: 12/29/2023 CLINICAL DATA:  Abdominal pain, acute, nonlocalized. EXAM: CT ABDOMEN AND PELVIS WITH CONTRAST TECHNIQUE: Multidetector CT imaging of the abdomen and pelvis was performed using the standard protocol following bolus administration of intravenous contrast. RADIATION DOSE REDUCTION: This exam was performed according to the departmental dose-optimization program which includes automated exposure control, adjustment of the mA  and/or kV according to patient size and/or use of iterative reconstruction technique. CONTRAST:  OMNIPAQUE  IOHEXOL  300 MG/ML  SOLN COMPARISON:  01/18/2008. FINDINGS: Lower chest: The heart is normal in size and there is a trace pericardial effusion. Minimal atelectasis is noted at the lung bases. Hepatobiliary: No focal abnormality in the  liver. Fatty infiltration of the liver is noted. The gallbladder is surgically absent. No biliary ductal dilatation. Pancreas: Unremarkable. No pancreatic ductal dilatation or surrounding inflammatory changes. Spleen: Normal in size without focal abnormality. Adrenals/Urinary Tract: The adrenal glands are within normal limits. The kidneys enhance normally. No renal calculus or hydronephrosis bilaterally. The bladder is unremarkable. Stomach/Bowel: The stomach is within normal limits. There is colonic diverticulosis with pericolonic fat stranding about the sigmoid colon. Suspected tiny foci of air are identified along the sigmoid colon, best seen on axial image 66 suggesting micro perforation. No abscess is seen. Appendix appears normal. No obstruction or pneumatosis. Vascular/Lymphatic: Aortic atherosclerosis. No abdominal or pelvic lymphadenopathy by size criteria. Reproductive: Uterus and bilateral adnexa are unremarkable. Other: There is a trace amount of free fluid adjacent to the sigmoid colon on the left. Musculoskeletal: Degenerative changes are present in the thoracolumbar spine. No acute osseous abnormality. IMPRESSION: 1. Acute sigmoid diverticulitis with a trace amount of free air suggesting micro perforation. No abscess is seen. 2. Hepatic steatosis. 3. Aortic atherosclerosis. Electronically Signed: By: Leita Birmingham M.D. On: 12/29/2023 17:06      Labs: BNP (last 3 results) No results for input(s): BNP in the last 8760 hours. Basic Metabolic Panel: Recent Labs  Lab 12/29/23 1321 12/30/23 0406 12/31/23 0917  NA 139 139 140  K 3.8 4.1 4.1  CL 105 108 102  CO2 21* 23 24  GLUCOSE 134* 108* 91  BUN 16 11 10   CREATININE 0.77 0.71 0.75  CALCIUM 9.9 8.5* 8.9  MG 1.9  --   --   PHOS 3.3  --   --    Liver Function Tests: Recent Labs  Lab 12/29/23 1321 12/31/23 0917  AST 26 17  ALT 20 18  ALKPHOS 77 58  BILITOT 0.6 0.8  PROT 8.0 7.1  ALBUMIN 4.2 3.4*   Recent Labs  Lab  12/29/23 1321  LIPASE 32   No results for input(s): AMMONIA in the last 168 hours. CBC: Recent Labs  Lab 12/29/23 1321 12/30/23 0552 12/31/23 0917 01/02/24 0308  WBC 21.0* 16.0* 10.6* 11.0*  NEUTROABS 17.9*  --  7.7  --   HGB 14.6 12.5 12.1 12.7  HCT 45.0 40.3 39.8 40.1  MCV 80.5 82.8 83.4 80.8  PLT 354 312 286 321   Cardiac Enzymes: No results for input(s): CKTOTAL, CKMB, CKMBINDEX, TROPONINI in the last 168 hours. BNP: Invalid input(s): POCBNP CBG: No results for input(s): GLUCAP in the last 168 hours. D-Dimer No results for input(s): DDIMER in the last 72 hours. Hgb A1c No results for input(s): HGBA1C in the last 72 hours. Lipid Profile No results for input(s): CHOL, HDL, LDLCALC, TRIG, CHOLHDL, LDLDIRECT in the last 72 hours. Thyroid function studies No results for input(s): TSH, T4TOTAL, T3FREE, THYROIDAB in the last 72 hours.  Invalid input(s): FREET3 Anemia work up No results for input(s): VITAMINB12, FOLATE, FERRITIN, TIBC, IRON, RETICCTPCT in the last 72 hours. Urinalysis    Component Value Date/Time   COLORURINE YELLOW (A) 12/29/2023 1321   APPEARANCEUR CLEAR (A) 12/29/2023 1321   LABSPEC 1.018 12/29/2023 1321   PHURINE 6.0 12/29/2023 1321   GLUCOSEU NEGATIVE 12/29/2023 1321  HGBUR NEGATIVE 12/29/2023 1321   BILIRUBINUR NEGATIVE 12/29/2023 1321   KETONESUR NEGATIVE 12/29/2023 1321   PROTEINUR NEGATIVE 12/29/2023 1321   NITRITE NEGATIVE 12/29/2023 1321   LEUKOCYTESUR NEGATIVE 12/29/2023 1321   Sepsis Labs Recent Labs  Lab 12/29/23 1321 12/30/23 0552 12/31/23 0917 01/02/24 0308  WBC 21.0* 16.0* 10.6* 11.0*   Microbiology Recent Results (from the past 240 hours)  Blood culture (routine x 2)     Status: None (Preliminary result)   Collection Time: 12/29/23  2:46 PM   Specimen: BLOOD  Result Value Ref Range Status   Specimen Description BLOOD BLOOD RIGHT FOREARM  Final   Special Requests    Final    BOTTLES DRAWN AEROBIC AND ANAEROBIC Blood Culture adequate volume   Culture   Final    NO GROWTH 4 DAYS Performed at [facility], [address] [place], [place] [address]    Report Status PENDING  Incomplete  Blood culture (routine x 2)     Status: None (Preliminary result)   Collection Time: 12/29/23  2:47 PM   Specimen: BLOOD  Result Value Ref Range Status   Specimen Description BLOOD LEFT ANTECUBITAL  Final   Special Requests   Final    BOTTLES DRAWN AEROBIC AND ANAEROBIC Blood Culture results may not be optimal due to an inadequate volume of blood received in culture bottles   Culture   Final    NO GROWTH 4 DAYS Performed at [facility], [address], [place], [place] [address]    Report Status PENDING  Incomplete     Total time spend on discharging this patient, including the last patient exam, discussing the hospital stay, instructions for ongoing care as it relates to all pertinent caregivers, as well as preparing the medical discharge records, prescriptions, and/or referrals as applicable, is 35 minutes.    Ellouise Haber, MD  [facility] 01/02/2024, 8:58 AM

## 2024-01-02 NOTE — Plan of Care (Signed)

## 2024-01-02 NOTE — Progress Notes (Signed)
 Patient having medication delivered from the pharmacy.

## 2024-01-02 NOTE — Progress Notes (Signed)
 Waiting on work note for patient from the doctor.

## 2024-01-03 LAB — CULTURE, BLOOD (ROUTINE X 2)
Culture: NO GROWTH
Culture: NO GROWTH
Special Requests: ADEQUATE

## 2024-01-09 ENCOUNTER — Ambulatory Visit (INDEPENDENT_AMBULATORY_CARE_PROVIDER_SITE_OTHER): Payer: Self-pay | Admitting: Surgery

## 2024-01-09 ENCOUNTER — Encounter: Payer: Self-pay | Admitting: Surgery

## 2024-01-09 VITALS — BP 164/119 | HR 101 | Temp 98.3°F | Ht 64.0 in | Wt 241.0 lb

## 2024-01-09 DIAGNOSIS — Z6841 Body Mass Index (BMI) 40.0 and over, adult: Secondary | ICD-10-CM | POA: Diagnosis not present

## 2024-01-09 DIAGNOSIS — K5720 Diverticulitis of large intestine with perforation and abscess without bleeding: Secondary | ICD-10-CM

## 2024-01-09 DIAGNOSIS — Z09 Encounter for follow-up examination after completed treatment for conditions other than malignant neoplasm: Secondary | ICD-10-CM | POA: Diagnosis not present

## 2024-01-09 DIAGNOSIS — E66813 Obesity, class 3: Secondary | ICD-10-CM | POA: Diagnosis not present

## 2024-01-09 DIAGNOSIS — E669 Obesity, unspecified: Secondary | ICD-10-CM | POA: Insufficient documentation

## 2024-01-09 NOTE — Progress Notes (Signed)
 Surgical Clinic Progress/Follow-up Note   HPI:  58 y.o. Female presents to clinic for diverticulitis follow-up  The last evaluation was prior to her discharge. Patient reports  improvement of prior issues and has been tolerating regular diet with +flatus and normal BM's, denies N/V, fever/chills, CP, or SOB. She has been very conscientious regarding natural sources of fiber and vegetable intake.  She is also taking supplemental Metamucil and MiraLAX.  She reports some degree of bloating and a mild degree of left lower quadrant discomfort at times when she is trying to sit up and perform her work from home.  Overall she seems to be making excellent progress and has a few antibiotic pills left to take.  Review of Systems:  Constitutional: denies fever/chills  ENT: denies sore throat, hearing problems  Respiratory: denies shortness of breath, wheezing  Cardiovascular: denies chest pain, palpitations  Gastrointestinal: denies abdominal pain, N/V, or diarrhea/and bowel function as per interval history Skin: Denies any other rashes or skin discolorations  Vital Signs:  BP (!) 164/119   Pulse (!) 101   Temp 98.3 F (36.8 C) (Oral)   Ht 5' 4 (1.626 m)   Wt 241 lb (109.3 kg)   SpO2 96%   BMI 41.37 kg/m    Physical Exam:  Constitutional:  -- Obese body habitus  -- Awake, alert, and oriented x3  Pulmonary:  -- Breathing non-labored at rest Cardiovascular:  -- Well-perfused Gastrointestinal:  -- Soft and non-distended, non-tender/with mild and no objective left lower quadrant tenderness to palpation, no guarding/rebound tenderness -- No abdominal masses appreciated, pulsatile or otherwise   Musculoskeletal / Integumentary:  -- Wounds or skin discoloration: None appreciated. -- Extremities: B/L UE and LE FROM, hands and feet warm, no edema   Laboratory studies: Nothing new since her discharge.  Last white blood cell count prior to discharge was 11  Imaging: No new pertinent imaging  available for review   Assessment:  58 y.o. yo Female with a problem list including...  Patient Active Problem List   Diagnosis Date Noted   Obesity, unspecified 01/09/2024   Diverticulitis 12/29/2023   Abdominal pain 12/29/2023   History of pancreatitis 12/29/2023   Sinus tachycardia 12/29/2023   Sepsis (HCC) 12/29/2023   Pruritus 12/29/2023    presents to clinic for follow-up evaluation of diverticulitis, progressing well.  Plan:              - return to clinic in 2 months or as needed, instructed to call office if any questions or concerns  - We discussed her diet, and the eventual need for a colonoscopy, we will not pursue that at this time.  All of the above recommendations were discussed with the patient, and all of patient's questions were answered to her expressed satisfaction.  I personally spent a total of 30 minutes in the care of the patient today including preparing to see the patient, getting/reviewing separately obtained history, performing a medically appropriate exam/evaluation, counseling and educating, and documenting clinical information in the EHR.   These notes generated with voice recognition software. I apologize for typographical errors.  Honor Leghorn, MD, FACS [place]: [facility] General Surgery - Partnering for exceptional care. Office: [phone]

## 2024-01-09 NOTE — Patient Instructions (Signed)
 Diverticulitis  Diverticulitis is when small pouches in your colon get infected or swollen. This causes pain in your belly (abdomen) and watery poop (diarrhea). The small pouches are called diverticula. They may form if you have a condition called diverticulosis. What are the causes? You may get this condition if poop (stool) gets trapped in the pouches in your colon. The poop lets germs (bacteria) grow. This causes an infection. What increases the risk? You are more likely to get this condition if you have small pouches in your colon. You are also more likely to get it if: You are overweight or very overweight (obese). You do not exercise enough. You drink alcohol. You smoke. You eat a lot of red meat, like beef, pork, or lamb. You do not eat enough fiber. You are older than 60 years of age. What are the signs or symptoms? Pain in your belly. Pain is often on the left side, but it may be felt in other spots too. Fever and chills. Feeling like you may vomit. Vomiting. Having cramps. Feeling full. Changes in how often you poop. Blood in your poop. How is this treated? Most cases are treated at home. You may be told to: Take over-the-counter pain medicines. Only eat and drink clear liquids. Take antibiotics. Rest. Very bad cases may need to be treated at a hospital. Treatment may include: Not eating or drinking. Taking pain medicines. Getting antibiotics through an IV tube. Getting fluid and food through an IV tube. Having surgery. When you are feeling better, you may need to have a test to look at your colon (colonoscopy). Follow these instructions at home: Medicines Take over-the-counter and prescription medicines only as told by your doctor. These include: Fiber pills. Probiotics. Medicines to make your poop soft (stool softeners). If you were prescribed antibiotics, take them as told by your doctor. Do not stop taking them even if you start to feel better. Ask your  doctor if you should avoid driving or using machines while you are taking your medicine. Eating and drinking  Follow the diet told by your doctor. You may need to only eat and drink liquids. When you feel better, you may be able to eat more foods. You may also be told to eat a lot of fiber. Fiber helps you poop. Foods with fiber include berries, beans, lentils, and green vegetables. Try not to eat red meat. General instructions Do not smoke or use any products that contain nicotine or tobacco. If you need help quitting, ask your doctor. Exercise 3 or more times a week. Try to go for 30 minutes each time. Exercise enough to sweat and make your heart beat faster. Contact a doctor if: Your pain gets worse. You are not pooping like normal. Your symptoms do not get better. Your symptoms get worse very fast. You have a fever. You vomit more than one time. You have poop that is: Bloody. Black. Tarry. This information is not intended to replace advice given to you by your health care provider. Make sure you discuss any questions you have with your health care provider. Document Revised: 02/01/2022 Document Reviewed: 02/01/2022 Elsevier Patient Education  2024 [employer].

## 2024-03-02 NOTE — Progress Notes (Deleted)
 Surgical Clinic Progress/Follow-up Note   HPI:  58 y.o. Female presents to clinic for diverticulitis follow-up  The last evaluation was prior to her discharge. Patient reports  improvement of prior issues and has been tolerating regular diet with +flatus and normal BM's, denies N/V, fever/chills, CP, or SOB. She has been very conscientious regarding natural sources of fiber and vegetable intake.  She is also taking supplemental Metamucil and MiraLAX.  She reports some degree of bloating and a mild degree of left lower quadrant discomfort at times when she is trying to sit up and perform her work from home.  Overall she seems to be making excellent progress and has a few antibiotic pills left to take.  Review of Systems:  Constitutional: denies fever/chills  ENT: denies sore throat, hearing problems  Respiratory: denies shortness of breath, wheezing  Cardiovascular: denies chest pain, palpitations  Gastrointestinal: denies abdominal pain, N/V, or diarrhea/and bowel function as per interval history Skin: Denies any other rashes or skin discolorations  Vital Signs:  There were no vitals taken for this visit.   Physical Exam:  Constitutional:  -- Obese body habitus  -- Awake, alert, and oriented x3  Pulmonary:  -- Breathing non-labored at rest Cardiovascular:  -- Well-perfused Gastrointestinal:  -- Soft and non-distended, non-tender/with mild and no objective left lower quadrant tenderness to palpation, no guarding/rebound tenderness -- No abdominal masses appreciated, pulsatile or otherwise   Musculoskeletal / Integumentary:  -- Wounds or skin discoloration: None appreciated. -- Extremities: B/L UE and LE FROM, hands and feet warm, no edema   Laboratory studies: Nothing new since her discharge.  Last white blood cell count prior to discharge was 11  Imaging: No new pertinent imaging available for review   Assessment:  58 y.o. yo Female with a problem list including...  Patient  Active Problem List   Diagnosis Date Noted   Obesity, unspecified 01/09/2024   Diverticulitis 12/29/2023   Abdominal pain 12/29/2023   History of pancreatitis 12/29/2023   Sinus tachycardia 12/29/2023   Sepsis (HCC) 12/29/2023   Pruritus 12/29/2023    presents to clinic for follow-up evaluation of diverticulitis, progressing well.  Plan:              - return to clinic in 2 months or as needed, instructed to call office if any questions or concerns  - We discussed her diet, and the eventual need for a colonoscopy, we will not pursue that at this time.  All of the above recommendations were discussed with the patient, and all of patient's questions were answered to her expressed satisfaction.  I personally spent a total of 30 minutes in the care of the patient today including preparing to see the patient, getting/reviewing separately obtained history, performing a medically appropriate exam/evaluation, counseling and educating, and documenting clinical information in the EHR.   These notes generated with voice recognition software. I apologize for typographical errors.  Honor Leghorn, MD, FACS [place]: [facility] General Surgery - Partnering for exceptional care. Office: [phone]

## 2024-03-05 ENCOUNTER — Ambulatory Visit: Admitting: Surgery

## 2024-03-12 ENCOUNTER — Ambulatory Visit: Admitting: Surgery
